# Patient Record
Sex: Female | Born: 1985 | Hispanic: Yes | Marital: Single | Smoking: Never smoker
Health system: Southern US, Community
[De-identification: ages and names within clinical notes are randomized; demographics above are authoritative.]

---

## 2015-03-11 LAB — OB RESULTS CONSOLE GC/CHLAMYDIA
Chlamydia: NEGATIVE
Gonorrhea: NEGATIVE

## 2015-03-11 LAB — OB RESULTS CONSOLE RUBELLA ANTIBODY, IGM: RUBELLA: NON-IMMUNE/NOT IMMUNE

## 2015-03-11 LAB — OB RESULTS CONSOLE ABO/RH: RH TYPE: POSITIVE

## 2015-03-11 LAB — OB RESULTS CONSOLE HIV ANTIBODY (ROUTINE TESTING): HIV: NONREACTIVE

## 2015-03-11 LAB — OB RESULTS CONSOLE RPR: RPR: NONREACTIVE

## 2015-03-11 LAB — OB RESULTS CONSOLE ANTIBODY SCREEN: ANTIBODY SCREEN: NEGATIVE

## 2015-03-11 LAB — OB RESULTS CONSOLE HEPATITIS B SURFACE ANTIGEN: HEP B S AG: NEGATIVE

## 2015-03-15 NOTE — L&D Delivery Note (Cosign Needed)
Delivery Note At 10:08 PM a viable female was delivered via nursing personel (Presentation:vertex ;  ).  APGAR:9 ,9 ; weight  .   Placenta status:spont ,via shultz .  Cord: 3 vc with the following complications: none .  Cord pH: n/a  Anesthesia: None  Episiotomy:  none Lacerations:  none Suture Repair: n/a Est. Blood Loss 100 (mL):    Mom to postpartum.  Baby to Couplet care / Skin to Skin.  Kendra Fitzgerald 07/12/2015, 10:17 PM

## 2015-03-19 ENCOUNTER — Other Ambulatory Visit (HOSPITAL_COMMUNITY): Payer: Self-pay | Admitting: Urology

## 2015-03-19 DIAGNOSIS — Z3689 Encounter for other specified antenatal screening: Secondary | ICD-10-CM

## 2015-04-10 ENCOUNTER — Ambulatory Visit (HOSPITAL_COMMUNITY)
Admission: RE | Admit: 2015-04-10 | Discharge: 2015-04-10 | Disposition: A | Discharge status: Home / Self Care | Payer: Medicaid Other | Source: Ambulatory Visit | Attending: Physician Assistant | Admitting: Physician Assistant

## 2015-04-10 DIAGNOSIS — Z3A26 26 weeks gestation of pregnancy: Secondary | ICD-10-CM | POA: Insufficient documentation

## 2015-04-10 DIAGNOSIS — Z36 Encounter for antenatal screening of mother: Secondary | ICD-10-CM | POA: Insufficient documentation

## 2015-04-10 DIAGNOSIS — Z3689 Encounter for other specified antenatal screening: Secondary | ICD-10-CM

## 2015-06-19 LAB — OB RESULTS CONSOLE GC/CHLAMYDIA
CHLAMYDIA, DNA PROBE: NEGATIVE
Gonorrhea: NEGATIVE

## 2015-06-19 LAB — OB RESULTS CONSOLE GBS: GBS: NEGATIVE

## 2015-07-12 ENCOUNTER — Inpatient Hospital Stay (HOSPITAL_COMMUNITY)
Admission: AD | Admit: 2015-07-12 | Discharge: 2015-07-14 | DRG: 775 | Disposition: A | Discharge status: Home / Self Care | Payer: Medicaid Other | Source: Ambulatory Visit | Attending: Obstetrics and Gynecology | Admitting: Obstetrics and Gynecology

## 2015-07-12 ENCOUNTER — Inpatient Hospital Stay (HOSPITAL_COMMUNITY)
Admission: AD | Admit: 2015-07-12 | Discharge: 2015-07-12 | Disposition: A | Discharge status: Home / Self Care | Payer: Medicaid Other | Source: Ambulatory Visit | Attending: Obstetrics and Gynecology | Admitting: Obstetrics and Gynecology

## 2015-07-12 ENCOUNTER — Encounter (HOSPITAL_COMMUNITY): Payer: Self-pay | Admitting: *Deleted

## 2015-07-12 DIAGNOSIS — Z3A40 40 weeks gestation of pregnancy: Secondary | ICD-10-CM | POA: Diagnosis not present

## 2015-07-12 DIAGNOSIS — O471 False labor at or after 37 completed weeks of gestation: Secondary | ICD-10-CM | POA: Insufficient documentation

## 2015-07-12 DIAGNOSIS — IMO0001 Reserved for inherently not codable concepts without codable children: Secondary | ICD-10-CM

## 2015-07-12 LAB — CBC
HCT: 41 % (ref 36.0–46.0)
Hemoglobin: 14.5 g/dL (ref 12.0–15.0)
MCH: 32.4 pg (ref 26.0–34.0)
MCHC: 35.4 g/dL (ref 30.0–36.0)
MCV: 91.7 fL (ref 78.0–100.0)
Platelets: 218 10*3/uL (ref 150–400)
RBC: 4.47 MIL/uL (ref 3.87–5.11)
RDW: 13.8 % (ref 11.5–15.5)
WBC: 10 10*3/uL (ref 4.0–10.5)

## 2015-07-12 LAB — TYPE AND SCREEN
ABO/RH(D): O POS
ANTIBODY SCREEN: NEGATIVE

## 2015-07-12 LAB — ABO/RH: ABO/RH(D): O POS

## 2015-07-12 MED ORDER — LIDOCAINE HCL (PF) 1 % IJ SOLN
30.0000 mL | INTRAMUSCULAR | Status: DC | PRN
  Filled 2015-07-12: qty 30

## 2015-07-12 MED ORDER — OXYCODONE-ACETAMINOPHEN 5-325 MG PO TABS: 2.0000 | ORAL_TABLET | ORAL | Status: DC | PRN

## 2015-07-12 MED ORDER — LACTATED RINGERS IV SOLN: 500.0000 mL | INTRAVENOUS | Status: DC | PRN

## 2015-07-12 MED ORDER — CITRIC ACID-SODIUM CITRATE 334-500 MG/5ML PO SOLN: 30.0000 mL | ORAL | Status: DC | PRN

## 2015-07-12 MED ORDER — LACTATED RINGERS IV SOLN
INTRAVENOUS | Status: DC
  Administered 2015-07-12 (×2): via INTRAVENOUS

## 2015-07-12 MED ORDER — FLEET ENEMA 7-19 GM/118ML RE ENEM: 1.0000 | ENEMA | RECTAL | Status: DC | PRN

## 2015-07-12 MED ORDER — OXYTOCIN 10 UNIT/ML IJ SOLN
2.5000 [IU]/h | INTRAVENOUS | Status: DC
  Filled 2015-07-12: qty 4

## 2015-07-12 MED ORDER — OXYCODONE-ACETAMINOPHEN 5-325 MG PO TABS: 1.0000 | ORAL_TABLET | ORAL | Status: DC | PRN

## 2015-07-12 MED ORDER — OXYTOCIN BOLUS FROM INFUSION
500.0000 mL | INTRAVENOUS | Status: DC
  Administered 2015-07-12: 500 mL via INTRAVENOUS

## 2015-07-12 MED ORDER — FENTANYL CITRATE (PF) 100 MCG/2ML IJ SOLN: 100.0000 ug | INTRAMUSCULAR | Status: DC | PRN

## 2015-07-12 MED ORDER — ACETAMINOPHEN 325 MG PO TABS: 650.0000 mg | ORAL_TABLET | ORAL | Status: DC | PRN

## 2015-07-12 MED ORDER — FENTANYL CITRATE (PF) 100 MCG/2ML IJ SOLN
INTRAMUSCULAR | Status: AC
  Administered 2015-07-12: 100 ug
  Filled 2015-07-12: qty 2

## 2015-07-12 MED ORDER — ONDANSETRON HCL 4 MG/2ML IJ SOLN: 4.0000 mg | Freq: Four times a day (QID) | INTRAMUSCULAR | Status: DC | PRN

## 2015-07-12 NOTE — Discharge Instructions (Signed)
Contracciones de Braxton Hicks °(Braxton Hicks Contractions) °Durante el embarazo, pueden presentarse contracciones uterinas que no siempre indican que está en trabajo de parto.  °¿QUÉ SON LAS CONTRACCIONES DE BRAXTON HICKS?  °Las contracciones que se presentan antes del trabajo de parto se conocen como contracciones de Braxton Hicks o falso trabajo de parto. Hacia el final del embarazo (32 a 34 semanas), estas contracciones pueden aparecen con más frecuencia y volverse más intensas. No corresponden al trabajo de parto verdadero porque estas contracciones no producen el agrandamiento (la dilatación) y el afinamiento del cuello del útero. Algunas veces, es difícil distinguirlas del trabajo de parto verdadero porque en algunos casos pueden ser muy intensas, y las personas tienen diferentes niveles de tolerancia al dolor. No debe sentirse avergonzada si concurre al hospital con falso trabajo de parto. En ocasiones, la única forma de saber si el trabajo de parto es verdadero es que el médico determine si hay cambios en el cuello del útero. °Si no hay problemas prenatales u otras complicaciones de salud asociadas con el embarazo, no habrá inconvenientes si la envían a su casa con falso trabajo de parto y espera que comience el verdadero. °CÓMO DIFERENCIAR EL TRABAJO DE PARTO FALSO DEL VERDADERO °Falso trabajo de parto °· Las contracciones del falso trabajo de parto duran menos y no son tan intensas como las verdaderas. °· Generalmente son irregulares. °· A menudo, se sienten en la parte delantera de la parte baja del abdomen y en la ingle, °· y pueden desaparecer cuando camina o cambia de posición mientras está acostada. °· Las contracciones se vuelven más débiles y su duración es menor a medida que el tiempo transcurre. °· Por lo general, no se hacen progresivamente más intensas, regulares y cercanas entre sí como en el caso del trabajo de parto verdadero. °Verdadero trabajo de parto °· Las contracciones del verdadero  trabajo de parto duran de 30 a 70 segundos, son muy regulares y suelen volverse más intensas, y aumenta su frecuencia. °· No desaparecen cuando camina. °· La molestia generalmente se siente en la parte superior del útero y se extiende hacia la zona inferior del abdomen y hacia la cintura. °· El médico podrá examinarla para determinar si el trabajo de parto es verdadero. El examen mostrará si el cuello del útero se está dilatando y afinando. °LO QUE DEBE RECORDAR °· Continúe haciendo los ejercicios habituales y siga otras indicaciones que el médico le dé. °· Tome todos los medicamentos como le indicó el médico. °· Concurra a las visitas prenatales regulares. °· Coma y beba con moderación si cree que está en trabajo de parto. °· Si las contracciones de Braxton Hicks le provocan incomodidad: °¨ Cambie de posición: si está acostada o descansando, camine; si está caminando, descanse. °¨ Siéntese y descanse en una bañera con agua tibia. °¨ Beba 2 o 3 vasos de agua. La deshidratación puede provocar contracciones. °¨ Respire lenta y profundamente varias veces por hora. °¿CUÁNDO DEBO BUSCAR ASISTENCIA MÉDICA INMEDIATA? °Solicite atención médica de inmediato si: °· Las contracciones se intensifican, se hacen más regulares y cercanas entre sí. °· Tiene una pérdida de líquido por la vagina. °· Tiene fiebre. °· Elimina mucosidad manchada con sangre. °· Tiene una hemorragia vaginal abundante. °· Tiene dolor abdominal permanente. °· Tiene un dolor en la zona lumbar que nunca tuvo antes. °· Siente que la cabeza del bebé empuja hacia abajo y ejerce presión en la zona pélvica. °· El bebé no se mueve tanto como solía. °  °Esta información no tiene como fin   reemplazar el consejo del médico. Asegúrese de hacerle al médico cualquier pregunta que tenga. °  °Document Released: 12/08/2004 Document Revised: 03/05/2013 °Elsevier Interactive Patient Education ©2016 [employer] ° °

## 2015-07-12 NOTE — H&P (Signed)
Kendra Fitzgerald is a 30 y.o. female presenting for active labor. Maternal Medical History:  Reason for admission: Contractions.   Contractions: Onset was 6-12 hours ago.   Frequency: regular.   Perceived severity is moderate.    Fetal activity: Perceived fetal activity is normal.   Last perceived fetal movement was within the past hour.    Prenatal complications: no prenatal complications Prenatal Complications - Diabetes: none.    OB History    Gravida Para Term Preterm AB TAB SAB Ectopic Multiple Living   2 1 1  0 0 0 0 0 0 1     History reviewed. No pertinent past medical history. History reviewed. No pertinent past surgical history. Family History: family history is not on file. Social History:  reports that she has never smoked. She does not have any smokeless tobacco history on file. She reports that she does not drink alcohol or use illicit drugs.   Prenatal Transfer Tool  Maternal Diabetes: No Genetic Screening: Normal Maternal Ultrasounds/Referrals: Normal Fetal Ultrasounds or other Referrals:  None Maternal Substance Abuse:  No Significant Maternal Medications:  None Significant Maternal Lab Results:  None Other Comments:  None  Review of Systems  Constitutional: Negative.   HENT: Negative.   Eyes: Negative.   Respiratory: Negative.   Cardiovascular: Negative.   Gastrointestinal: Positive for abdominal pain.  Genitourinary: Negative.   Musculoskeletal: Positive for back pain.  Skin: Negative.   Neurological: Negative.   Endo/Heme/Allergies: Negative.   Psychiatric/Behavioral: Negative.     Dilation: 5 Effacement (%): 100 Station: -2 Exam by:: K. WeissRN Blood pressure 119/77, pulse 67, temperature 97.4 F (36.3 C), temperature source Oral, resp. rate 20, height 4\' 8"  (1.422 m), weight 143 lb (64.864 kg), SpO2 95 %. Maternal Exam:  Uterine Assessment: Contraction strength is moderate.  Contraction frequency is regular.   Abdomen: Patient  reports no abdominal tenderness. Fetal presentation: vertex  Introitus: Normal vulva. Normal vagina.  Amniotic fluid character: not assessed.  Pelvis: adequate for delivery.   Cervix: Cervix evaluated by digital exam.     Fetal Exam Fetal Monitor Review: Mode: ultrasound.   Variability: moderate (6-25 bpm).   Pattern: accelerations present.    Fetal State Assessment: Category I - tracings are normal.     Physical Exam  Constitutional: She is oriented to person, place, and time. She appears well-developed and well-nourished.  HENT:  Head: Normocephalic.  Eyes: Pupils are equal, round, and reactive to light.  Cardiovascular: Normal rate, regular rhythm, normal heart sounds and intact distal pulses.   Respiratory: Effort normal and breath sounds normal.  GI: Soft. Bowel sounds are normal.  Genitourinary: Vagina normal.  Musculoskeletal: Normal range of motion.  Neurological: She is alert and oriented to person, place, and time. She has normal reflexes.  Skin: Skin is warm and dry.  Psychiatric: She has a normal mood and affect. Her behavior is normal. Judgment and thought content normal.    Prenatal labs: ABO, Rh: --/--/O POS (04/30 2005) Antibody: PENDING (04/30 2005) Rubella: Nonimmune (12/28 0000) RPR: Nonreactive (12/28 0000)  HBsAg: Negative (12/28 0000)  HIV: Non-reactive (12/28 0000)  GBS: Negative (04/07 0000)   Assessment/Plan: Admit, SVE 6-7/90/-1. GBS neg   Kendra Fitzgerald 07/12/2015, 8:42 PM

## 2015-07-12 NOTE — Progress Notes (Signed)
Assisted Registration with interpretation of admission. Spanish Interpreter

## 2015-07-12 NOTE — MAU Note (Signed)
Vaginal bleeding started last night.

## 2015-07-12 NOTE — Progress Notes (Signed)
Assisted RN with interpretation of assessment and admission questions.  Spanish Interpreter

## 2015-07-12 NOTE — Anesthesia Pain Management Evaluation Note (Signed)
  CRNA Pain Management Visit Note  Patient: Kendra Fitzgerald, 30 y.o., female  "Hello I am a member of the anesthesia team at [facility]. We have an anesthesia team available at all times to provide care throughout the hospital, including epidural management and anesthesia for C-section. I don't know your plan for the delivery whether it a natural birth, water birth, IV sedation, nitrous supplementation, doula or epidural, but we want to meet your pain goals."   1.Was your pain managed to your expectations on prior hospitalizations? Yes   2.What is your expectation for pain management during this hospitalization?     IV pain meds  3.How can we help you reach that goal? Desires natural with IV pain meds  Record the patient's initial score and the patient's pain goal.   Pain: 9  Pain Goal: 9  [facility] wants you to be able to say your pain was always managed very well.  [facility]ptlMERRITT,Carley Strickling 07/12/2015

## 2015-07-12 NOTE — MAU Provider Note (Signed)
  History     CSN: [id number]  Arrival date and time: 07/12/15 [id number]   None     Chief Complaint  Patient presents with  . Vaginal Bleeding   HPI  Kendra Fitzgerald, a 30 yo G2P1 at 5240+0 with no significant PMH, presents with vaginal bleeding in the context of initiation of contraction. Patient noticed a small amount of pink vaginal discharge last night while using the restroom. She compared the quantity of bleeding to the first day of a menstrual period. She states that contractions began afterwards, at 3:20am, which occurred infrequently and were accompanied with occasional vaginal bleeding. She says the only pain she is currently experiencing is that of the irregular contractions. She states that an ultrasound 6 days ago found no abnormalities of placentation. Patient denies any trauma or recent sexual intercourse which could have triggered the bleeding. Patient denies any substance use.   OB History    Gravida Para Term Preterm AB TAB SAB Ectopic Multiple Living   2 1 1  0 0 0 0 0 0 1      History reviewed. No pertinent past medical history.  History reviewed. No pertinent past surgical history.  No family history on file.  Social History  Substance Use Topics  . Smoking status: Never Smoker   . Smokeless tobacco: None  . Alcohol Use: No    Allergies: No Known Allergies  Prescriptions prior to admission  Medication Sig Dispense Refill Last Dose  . Prenatal Vit-Fe Fumarate-FA (MULTIVITAMIN-PRENATAL) 27-0.8 MG TABS tablet Take 1 tablet by mouth daily at 12 noon.   07/11/2015 at Unknown time    ROS  No fevers/chills No headaches No chest pain/SOB  Physical Exam   Blood pressure 105/78, pulse 76, temperature 97.7 F (36.5 C), temperature source Oral, resp. rate 18.  Physical Exam  Gen: alert, in NAD HEENT: NCAT, normal conjunctivae, moist oral mucosa Chest: normal WOB, lungs CTAB CV: normal rate and regular rhythm, normal S1 and S2, no m/r/g Abd:  nontender GU: SVE 2/50 with small amount of brown discharge on finger per RN Skin: no rashes or lesions noted Psych: cooperative, appropriate affect  Reactive NST Irregular contractions on toco  MAU Course  Procedures  MDM Given the small amount of bleeding (both reported and found on cervical exam) and the lack of pain, this bleeding is most likely a normal feature of the onset of labor given the friability of the cervix. More worrisome causes such as placental abruption seem less likely given no abdominal pain except during contractions and lack of trauma or substance history, and abnormalities of placentation such as vasa or placenta previa are less likely given her normal ultrasound findings last week.   Assessment and Plan  [facility] Loralyn Freshwatersidro is a 30 yo G2P1 at 40+0 with scant vaginal bleeding in the context of early and irregular contractions.  -- Not in active labor.  Small amount of bleeding may indicate some cervical change, but SVE 2/50 currently. -- Discharge home with strict labor precautions -- [facility]  Trish FountainKelly Garcia, MD 07/12/15, 10:59am

## 2015-07-13 ENCOUNTER — Encounter (HOSPITAL_COMMUNITY): Payer: Self-pay | Admitting: Student

## 2015-07-13 LAB — RPR: RPR Ser Ql: NONREACTIVE

## 2015-07-13 MED ORDER — IBUPROFEN 600 MG PO TABS
600.0000 mg | ORAL_TABLET | Freq: Four times a day (QID) | ORAL | Status: DC
  Administered 2015-07-13 – 2015-07-14 (×6): 600 mg via ORAL
  Filled 2015-07-13 (×7): qty 1

## 2015-07-13 MED ORDER — COCONUT OIL OIL: 1.0000 "application " | TOPICAL_OIL | Status: DC | PRN

## 2015-07-13 MED ORDER — ACETAMINOPHEN 325 MG PO TABS: 650.0000 mg | ORAL_TABLET | ORAL | Status: DC | PRN

## 2015-07-13 MED ORDER — SENNOSIDES-DOCUSATE SODIUM 8.6-50 MG PO TABS
2.0000 | ORAL_TABLET | ORAL | Status: DC
  Administered 2015-07-13 (×2): 2 via ORAL
  Filled 2015-07-13 (×2): qty 2

## 2015-07-13 MED ORDER — WITCH HAZEL-GLYCERIN EX PADS: 1.0000 "application " | MEDICATED_PAD | CUTANEOUS | Status: DC | PRN

## 2015-07-13 MED ORDER — PRENATAL MULTIVITAMIN CH
1.0000 | ORAL_TABLET | Freq: Every day | ORAL | Status: DC
  Administered 2015-07-13 – 2015-07-14 (×2): 1 via ORAL
  Filled 2015-07-13 (×2): qty 1

## 2015-07-13 MED ORDER — DIPHENHYDRAMINE HCL 25 MG PO CAPS: 25.0000 mg | ORAL_CAPSULE | Freq: Four times a day (QID) | ORAL | Status: DC | PRN

## 2015-07-13 MED ORDER — MEASLES, MUMPS & RUBELLA VAC ~~LOC~~ INJ
0.5000 mL | INJECTION | Freq: Once | SUBCUTANEOUS | Status: AC
Start: 2015-07-14 — End: 2015-07-14
  Administered 2015-07-14: 0.5 mL via SUBCUTANEOUS
  Filled 2015-07-13: qty 0.5

## 2015-07-13 MED ORDER — TETANUS-DIPHTH-ACELL PERTUSSIS 5-2.5-18.5 LF-MCG/0.5 IM SUSP: 0.5000 mL | Freq: Once | INTRAMUSCULAR | Status: DC

## 2015-07-13 MED ORDER — ZOLPIDEM TARTRATE 5 MG PO TABS: 5.0000 mg | ORAL_TABLET | Freq: Every evening | ORAL | Status: DC | PRN

## 2015-07-13 MED ORDER — BENZOCAINE-MENTHOL 20-0.5 % EX AERO: 1.0000 "application " | INHALATION_SPRAY | CUTANEOUS | Status: DC | PRN

## 2015-07-13 MED ORDER — SIMETHICONE 80 MG PO CHEW: 80.0000 mg | CHEWABLE_TABLET | ORAL | Status: DC | PRN

## 2015-07-13 MED ORDER — SODIUM CHLORIDE 0.9 % IV SOLN: 250.0000 mL | INTRAVENOUS | Status: DC | PRN

## 2015-07-13 MED ORDER — ONDANSETRON HCL 4 MG PO TABS: 4.0000 mg | ORAL_TABLET | ORAL | Status: DC | PRN

## 2015-07-13 MED ORDER — DIBUCAINE 1 % RE OINT: 1.0000 "application " | TOPICAL_OINTMENT | RECTAL | Status: DC | PRN

## 2015-07-13 MED ORDER — ONDANSETRON HCL 4 MG/2ML IJ SOLN: 4.0000 mg | INTRAMUSCULAR | Status: DC | PRN

## 2015-07-13 MED ORDER — SODIUM CHLORIDE 0.9% FLUSH: 3.0000 mL | Freq: Two times a day (BID) | INTRAVENOUS | Status: DC

## 2015-07-13 MED ORDER — SODIUM CHLORIDE 0.9% FLUSH: 3.0000 mL | INTRAVENOUS | Status: DC | PRN

## 2015-07-13 NOTE — Progress Notes (Signed)
Post Partum Day 1 Subjective: no complaints, up ad lib, voiding and tolerating PO  Objective: Blood pressure 104/55, pulse 60, temperature 97.7 F (36.5 C), temperature source Oral, resp. rate 18, height 4\' 8"  (1.422 m), weight 143 lb (64.864 kg), SpO2 95 %, unknown if currently breastfeeding.  Physical Exam:  General: alert, cooperative, appears stated age and no distress Lochia: appropriate Uterine Fundus: firm Incision: n/a DVT Evaluation: No evidence of DVT seen on physical exam. Negative Homan's sign. No cords or calf tenderness. No significant calf/ankle edema.   Recent Labs  07/12/15 2005  HGB 14.5  HCT 41.0    Assessment/Plan: Plan for discharge tomorrow   LOS: 1 day   Kendra Fitzgerald 07/13/2015, 7:20 AM

## 2015-07-13 NOTE — Lactation Note (Signed)
This note was copied from a baby's chart. Lactation Consultation Note [name] for 1 yr to her now 30 yr old. Hand expression taught w/good flow of colostrum. Encouraged mom to BF STS. Baby weight 5.15lbs. Mom states she is breast/formula feeding. FOB at bedside and is interpreter for mom if she doesn't understand. Mom has done well at answering the questions I have asked w/o asking FOB. Mom has everted cone shaped breast. Mom had baby wrapped in 2 blankets, unwrapped and encouraged STS. demonstrated football position. Mom liked that position. Discussed newborn behavior. Mom encouraged to feed baby 8-12 times/24 hours and with feeding cues. Reviewed Baby & Me book's Breastfeeding Basics. Encouraged to call for assistance if needed and to verify proper latch.[facility]/LC brochure given w/resources, support groups and LC services. Patient Name: Kendra Fitzgerald Today's Date: 07/13/2015 Reason for consult: Initial assessment   Maternal Data Has patient been taught Hand Expression?: Yes Does the patient have breastfeeding experience prior to this delivery?: Yes  Feeding Feeding Type: Breast Fed Length of feed: 15 min  LATCH Score/Interventions Latch: Grasps breast easily, tongue down, lips flanged, rhythmical sucking. Intervention(s): Adjust position;Assist with latch;Breast massage;Breast compression  Audible Swallowing: Spontaneous and intermittent Intervention(s): Skin to skin;Hand expression;Alternate breast massage  Type of Nipple: Everted at rest and after stimulation  Comfort (Breast/Nipple): Soft / non-tender     Hold (Positioning): No assistance needed to correctly position infant at breast. Intervention(s): Skin to skin;Position options;Support Pillows;Breastfeeding basics reviewed  LATCH Score: 10  Lactation Tools Discussed/Used     Consult Status Consult Status: Follow-up Date: 07/14/15 Follow-up type: In-patient    Michio Thier, Diamond NickelLAURA G 07/13/2015, 5:03  AM

## 2015-07-13 NOTE — Lactation Note (Signed)
This note was copied from a baby's chart. Lactation Consultation Note  Patient Name: Kendra Fitzgerald IsidroCasimiro Needle [id number]'s Date: 07/13/2015 Reason for consult: Follow-up assessment   With this mom and term baby, now 3216 hours old, and reports breast feeding going well. Mom breast fed her first for 1 year, and denies any questions/concerns at this time. Mom knows to call for questions/concerns.    Maternal Data    Feeding Feeding Type: Breast Fed  LATCH Score/Interventions                      Lactation Tools Discussed/Used     Consult Status Consult Status: PRN Follow-up type: Call as needed    Alfred LevinsLee, Harrol Novello Anne 07/13/2015, 2:40 PM

## 2015-07-13 NOTE — Progress Notes (Signed)
UR chart review completed.  

## 2015-07-14 MED ORDER — IBUPROFEN 600 MG PO TABS: 600.0000 mg | ORAL_TABLET | Freq: Four times a day (QID) | ORAL | Status: DC

## 2015-07-14 NOTE — Discharge Instructions (Signed)

## 2015-07-14 NOTE — Discharge Summary (Signed)
OB Discharge Summary  Patient Name: Kendra Fitzgerald DOB: [birth year] MRN: [id number]  Date of admission: 07/12/2015 Delivering MD: Wyvonnia Dusky D   Date of discharge: 07/14/2015  Admitting diagnosis: 40 WKS, CTXS Intrauterine pregnancy: [redacted]w[redacted]d     Secondary diagnosis:Principal Problem:   NSVD (normal spontaneous vaginal delivery) Active Problems:   Active labor  Additional problems: None    Discharge diagnosis: Term Pregnancy Delivered                                                                     Post partum procedures:none  Augmentation: none  Complications: None  Hospital course:  Onset of Labor With Vaginal Delivery     30 y.o. yo [id number] at [redacted]w[redacted]d was admitted in Active Labor on 07/12/2015. Patient had an uncomplicated labor course as follows:  Membrane Rupture Time/Date: 12:00 PM ,07/12/2015   Intrapartum Procedures: Episiotomy: None [1]                                         Lacerations:  None [1]  Patient had a delivery of a Viable infant. 07/12/2015  Information for the patient's newborn:  Kendra Fitzgerald, Girl Kendra Fitzgerald [[id number]]  Delivery Method: Vaginal, Spontaneous Delivery (Filed from Delivery Summary)    Pateint had an uncomplicated postpartum course.  She is ambulating, tolerating a regular diet, passing flatus, and urinating well. Patient is discharged home in stable condition on 07/14/2015.    Physical exam  Filed Vitals:   07/13/15 0500 07/13/15 1225 07/13/15 1800 07/14/15 0631  BP: 104/55 95/59 107/66 100/50  Pulse: 60 61 70 53  Temp: 97.7 F (36.5 C) 98.5 F (36.9 C) 98.2 F (36.8 C) 97.8 F (36.6 C)  TempSrc: Oral Oral Oral Oral  Resp: Height:      Weight:      SpO2:       General: alert, cooperative and no distress Lochia: appropriate Uterine Fundus: firm Incision: N/A DVT Evaluation: No evidence of DVT seen on physical exam. Labs: Lab Results  Component Value Date   WBC 10.0 07/12/2015   HGB 14.5  07/12/2015   HCT 41.0 07/12/2015   MCV 91.7 07/12/2015   PLT 218 07/12/2015   No flowsheet data found.  Discharge instruction: per After Visit Summary and "Baby and Me Booklet".  After Visit Meds:    Medication List    TAKE these medications        ibuprofen 600 MG tablet  Commonly known as:  ADVIL,MOTRIN  Take 1 tablet (600 mg total) by mouth every 6 (six) hours.      ASK your doctor about these medications        multivitamin-prenatal 27-0.8 MG Tabs tablet  Take 1 tablet by mouth daily at 12 noon.        Diet: routine diet  Activity: Advance as tolerated. Pelvic rest for 6 weeks.   Outpatient follow up:6 weeks Follow up Appt:No future appointments. Follow up visit: No Follow-up on file.  Postpartum contraception: Depo Provera  Newborn Data: Live born female  Birth Weight: 5 lb 15.1 oz (2696 g) APGAR: 9,  10  Baby Feeding: Bottle and Breast Disposition:home with mother   07/14/2015 Hilton SinclairKaty D Mayo, MD  OB fellow attestation I have seen and examined this patient and agree with above documentation in the resident's note.   Kendra Fitzgerald is a 30 y.o. [phone] s/p NSVD.   Pain is well controlled.  Plan for birth control is Depo-Provera.  Method of Feeding: both  PE:  BP 100/50 mmHg  Pulse 53  Temp(Src) 97.8 F (36.6 C) (Oral)  Resp 18  Ht 4\' 8"  (1.422 m)  Wt 143 lb (64.864 kg)  BMI 32.08 kg/m2  SpO2 95%  Breastfeeding? Unknown Gen: well appearing Heart: reg rate Lungs: normal WOB Fundus firm Ext: soft, no pain, no edema   Recent Labs  07/12/15 2005  HGB 14.5  HCT 41.0   Plan: discharge today - postpartum care discussed - f/u clinic in 6 weeks for postpartum visit  Federico FlakeKimberly Niles Emiliano Welshans, MD 11:43 AM  Attending Physician: Tinnie Gensanya Pratt

## 2020-02-19 ENCOUNTER — Encounter (HOSPITAL_COMMUNITY): Payer: Self-pay

## 2020-02-19 ENCOUNTER — Other Ambulatory Visit: Payer: Self-pay

## 2020-02-19 ENCOUNTER — Ambulatory Visit (HOSPITAL_COMMUNITY)
Admission: EM | Admit: 2020-02-19 | Discharge: 2020-02-19 | Disposition: A | Discharge status: Home / Self Care | Payer: Self-pay | Attending: Emergency Medicine | Admitting: Emergency Medicine

## 2020-02-19 DIAGNOSIS — L03114 Cellulitis of left upper limb: Secondary | ICD-10-CM

## 2020-02-19 MED ORDER — DOXYCYCLINE HYCLATE 100 MG PO CAPS
100.0000 mg | ORAL_CAPSULE | Freq: Two times a day (BID) | ORAL | 0 refills | Status: AC
Start: 2020-02-19 — End: 2020-02-29

## 2020-02-19 NOTE — ED Provider Notes (Signed)
[facility]    CSN: [id number] Arrival date & time: 02/19/20  1342      History   Chief Complaint Chief Complaint  Patient presents with  . Arm Pain    left    HPI [name] is a 34 y.o. female.   Presents with left upper arm redness, pain, swelling x2 weeks.  She states she has Nexplanon implant which was supposed to be removed in October.  She was unable to get an appointment then.  She had a fever last week and went to have it removed; she states they would not remove it due to the infection; she was started on Septra DS which she has been taking since 02/13/2020.  She denies numbness, weakness, paresthesias in her arm.  She denies fever since last week.  The history is provided by the patient. A language interpreter was used.    History reviewed. No pertinent past medical history.  Patient Active Problem List   Diagnosis Date Noted  . NSVD (normal spontaneous vaginal delivery) 07/14/2015  . Active labor 07/12/2015    History reviewed. No pertinent surgical history.  OB History    Gravida  2   Para  2   Term  2   Preterm  0   AB  0   Living  2     SAB  0   TAB  0   Ectopic  0   Multiple  0   Live Births  1            Home Medications    Prior to Admission medications   Medication Sig Start Date End Date Taking? Authorizing Provider  doxycycline (VIBRAMYCIN) 100 MG capsule Take 1 capsule (100 mg total) by mouth 2 (two) times daily for 10 days. 02/19/20 02/29/20  Mickie Bail, NP  ibuprofen (ADVIL,MOTRIN) 600 MG tablet Take 1 tablet (600 mg total) by mouth every 6 (six) hours. 07/14/15   Mayo, Allyn Kenner, MD  Prenatal Vit-Fe Fumarate-FA (MULTIVITAMIN-PRENATAL) 27-0.8 MG TABS tablet Take 1 tablet by mouth daily at 12 noon.    [provider]    Family History History reviewed. No pertinent family history.  Social History Social History   Tobacco Use  . Smoking status: Never Smoker  . Smokeless tobacco: Never  Used  Substance Use Topics  . Alcohol use: No  . Drug use: No     Allergies   Patient has no known allergies.   Review of Systems Review of Systems  Constitutional: Negative for chills and fever.  HENT: Negative for ear pain and sore throat.   Eyes: Negative for pain and visual disturbance.  Respiratory: Negative for cough and shortness of breath.   Cardiovascular: Negative for chest pain and palpitations.  Gastrointestinal: Negative for abdominal pain and vomiting.  Genitourinary: Negative for dysuria and hematuria.  Musculoskeletal: Negative for arthralgias and back pain.  Skin: Positive for color change. Negative for wound.  Neurological: Negative for seizures, syncope, weakness and numbness.  All other systems reviewed and are negative.    Physical Exam Triage Vital Signs ED Triage Vitals  Enc Vitals Group     BP      Pulse      Resp      Temp      Temp src      SpO2      Weight      Height      Head Circumference      Peak  Flow      Pain Score      Pain Loc      Pain Edu?      Excl. in GC?    No data found.  Updated Vital Signs BP 105/64 (BP Location: Left Arm)   Pulse 79   Temp 97.8 F (36.6 C) (Oral)   Resp 17   LMP 02/03/2020 (Approximate)   SpO2 98%   Visual Acuity Right Eye Distance:   Left Eye Distance:   Bilateral Distance:    Right Eye Near:   Left Eye Near:    Bilateral Near:     Physical Exam Vitals and nursing note reviewed.  Constitutional:      General: She is not in acute distress.    Appearance: She is well-developed. She is not ill-appearing.  HENT:     Head: Normocephalic and atraumatic.     Mouth/Throat:     Mouth: Mucous membranes are moist.  Eyes:     Conjunctiva/sclera: Conjunctivae normal.  Cardiovascular:     Rate and Rhythm: Normal rate and regular rhythm.     Heart sounds: No murmur heard.   Pulmonary:     Effort: Pulmonary effort is normal. No respiratory distress.     Breath sounds: Normal breath  sounds.  Abdominal:     Palpations: Abdomen is soft.     Tenderness: There is no abdominal tenderness.  Musculoskeletal:        General: Swelling and tenderness present. Normal range of motion.     Cervical back: Neck supple.  Skin:    General: Skin is warm and dry.     Capillary Refill: Capillary refill takes less than 2 seconds.     Findings: Erythema present. No lesion.     Comments: Left upper arm: 3 cm area of firm nonfluctuant induration with surrounding erythema and mild edema.  No wounds.  Neurological:     General: No focal deficit present.     Mental Status: She is alert and oriented to person, place, and time.     Gait: Gait normal.  Psychiatric:        Mood and Affect: Mood normal.        Behavior: Behavior normal.      UC Treatments / Results  Labs (all labs ordered are listed, but only abnormal results are displayed) Labs Reviewed - No data to display  EKG   Radiology No results found.  Procedures Procedures (including critical care time)  Medications Ordered in UC Medications - No data to display  Initial Impression / Assessment and Plan / UC Course  I have reviewed the triage vital signs and the nursing notes.  Pertinent labs & imaging results that were available during my care of the patient were reviewed by me and considered in my medical decision making (see chart for details).   Cellulitis of left upper arm.  Needle aspiration with blood return only.  The area is nonfluctuant.  Instructed patient to stop taking the Septra DS and start doxycycline today.  Instructed her to call [facility] to schedule the soonest available appointment for removal of her Nexplanon.  Instructed her to go to the ED if she has signs of worsening infection which were reviewed in detail.  Patient agrees to plan of care.   Final Clinical Impressions(s) / UC Diagnoses   Final diagnoses:  Cellulitis of left upper arm     Discharge Instructions     Call  [facility] to schedule the soonest available appointment.  [phone]  Start taking the doxycycline as directed.    Go to the emergency department if you have signs of worsening infection, including increased redness, swelling, pain, fever, chills, or other concerning symptoms.           ED Prescriptions    Medication Sig Dispense Auth. Provider   doxycycline (VIBRAMYCIN) 100 MG capsule Take 1 capsule (100 mg total) by mouth 2 (two) times daily for 10 days. 20 capsule Mickie Bail, NP     PDMP not reviewed this encounter.   Mickie Bail, NP 02/19/20 1538

## 2020-02-19 NOTE — ED Triage Notes (Signed)
Pt presents with left arm pain and swelling. Pt states that she has the nexlpanon implant that was supposed to be removed on 10/10. Pt states she went to the health department, they mentioned she would have to have to be treated here. Pt states she had a fever last week. Pt states the implant was not removed, she was given the depo shot.

## 2020-02-19 NOTE — Discharge Instructions (Addendum)
Call [facility] to schedule the soonest available appointment.  [phone]  Start taking the doxycycline as directed.    Go to the emergency department if you have signs of worsening infection, including increased redness, swelling, pain, fever, chills, or other concerning symptoms.

## 2021-07-09 ENCOUNTER — Inpatient Hospital Stay (HOSPITAL_COMMUNITY)
Admission: EM | Admit: 2021-07-09 | Discharge: 2021-07-13 | DRG: 419 | Disposition: A | Discharge status: Home / Self Care | Payer: MEDICAID | Attending: Surgery | Admitting: Surgery

## 2021-07-09 ENCOUNTER — Emergency Department (HOSPITAL_COMMUNITY): Payer: MEDICAID

## 2021-07-09 ENCOUNTER — Inpatient Hospital Stay (HOSPITAL_COMMUNITY): Payer: MEDICAID

## 2021-07-09 ENCOUNTER — Encounter (HOSPITAL_COMMUNITY): Payer: Self-pay

## 2021-07-09 ENCOUNTER — Other Ambulatory Visit: Payer: Self-pay

## 2021-07-09 DIAGNOSIS — K851 Biliary acute pancreatitis without necrosis or infection: Principal | ICD-10-CM | POA: Diagnosis present

## 2021-07-09 DIAGNOSIS — K8020 Calculus of gallbladder without cholecystitis without obstruction: Principal | ICD-10-CM

## 2021-07-09 DIAGNOSIS — R1084 Generalized abdominal pain: Secondary | ICD-10-CM

## 2021-07-09 LAB — COMPREHENSIVE METABOLIC PANEL
ALT: 174 U/L — ABNORMAL HIGH (ref 0–44)
AST: 81 U/L — ABNORMAL HIGH (ref 15–41)
Albumin: 3.7 g/dL (ref 3.5–5.0)
Alkaline Phosphatase: 128 U/L — ABNORMAL HIGH (ref 38–126)
Anion gap: 6 (ref 5–15)
BUN: 7 mg/dL (ref 6–20)
CO2: 23 mmol/L (ref 22–32)
Calcium: 8.6 mg/dL — ABNORMAL LOW (ref 8.9–10.3)
Chloride: 108 mmol/L (ref 98–111)
Creatinine, Ser: 0.77 mg/dL (ref 0.44–1.00)
GFR, Estimated: >60 mL/min (ref >60)
Glucose, Bld: 115 mg/dL — ABNORMAL HIGH (ref 70–99)
Potassium: 3.5 mmol/L (ref 3.5–5.1)
Sodium: 137 mmol/L (ref 135–145)
Total Bilirubin: 1.9 mg/dL — ABNORMAL HIGH (ref 0.3–1.2)
Total Protein: 6.7 g/dL (ref 6.5–8.1)

## 2021-07-09 LAB — URINALYSIS, ROUTINE W REFLEX MICROSCOPIC
Bilirubin Urine: NEGATIVE
Glucose, UA: NEGATIVE mg/dL
Hgb urine dipstick: NEGATIVE
Ketones, ur: NEGATIVE mg/dL
Leukocytes,Ua: NEGATIVE
Nitrite: NEGATIVE
Protein, ur: 30 mg/dL — AB
Specific Gravity, Urine: 1.025 (ref 1.005–1.030)
pH: 5 (ref 5.0–8.0)

## 2021-07-09 LAB — CBC
HCT: 44.3 % (ref 36.0–46.0)
Hemoglobin: 14.4 g/dL (ref 12.0–15.0)
MCH: 28 pg (ref 26.0–34.0)
MCHC: 32.5 g/dL (ref 30.0–36.0)
MCV: 86.2 fL (ref 80.0–100.0)
Platelets: 283 10*3/uL (ref 150–400)
RBC: 5.14 MIL/uL — ABNORMAL HIGH (ref 3.87–5.11)
RDW: 14.7 % (ref 11.5–15.5)
WBC: 11.5 10*3/uL — ABNORMAL HIGH (ref 4.0–10.5)
nRBC: 0 % (ref 0.0–0.2)

## 2021-07-09 LAB — LIPASE, BLOOD: Lipase: 639 U/L — ABNORMAL HIGH (ref 11–51)

## 2021-07-09 LAB — I-STAT BETA HCG BLOOD, ED (MC, WL, AP ONLY): I-stat hCG, quantitative: <5 m[IU]/mL (ref <5)

## 2021-07-09 MED ORDER — HYDRALAZINE HCL 20 MG/ML IJ SOLN: 10.0000 mg | INTRAMUSCULAR | Status: DC | PRN

## 2021-07-09 MED ORDER — DIPHENHYDRAMINE HCL 25 MG PO CAPS
25.0000 mg | ORAL_CAPSULE | Freq: Four times a day (QID) | ORAL | Status: DC | PRN
Start: 2021-07-09 — End: 2021-07-13

## 2021-07-09 MED ORDER — ONDANSETRON 4 MG PO TBDP: 4.0000 mg | ORAL_TABLET | Freq: Four times a day (QID) | ORAL | Status: DC | PRN

## 2021-07-09 MED ORDER — MORPHINE SULFATE (PF) 4 MG/ML IV SOLN
4.0000 mg | INTRAVENOUS | Status: DC | PRN
  Administered 2021-07-09 – 2021-07-13 (×8): 4 mg via INTRAVENOUS
  Filled 2021-07-09 (×8): qty 1

## 2021-07-09 MED ORDER — ACETAMINOPHEN 650 MG RE SUPP: 650.0000 mg | Freq: Four times a day (QID) | RECTAL | Status: DC | PRN

## 2021-07-09 MED ORDER — ACETAMINOPHEN 325 MG PO TABS
650.0000 mg | ORAL_TABLET | Freq: Four times a day (QID) | ORAL | Status: DC | PRN
Start: 2021-07-09 — End: 2021-07-13

## 2021-07-09 MED ORDER — ONDANSETRON HCL 4 MG/2ML IJ SOLN
4.0000 mg | Freq: Four times a day (QID) | INTRAMUSCULAR | Status: DC | PRN
  Administered 2021-07-09: 4 mg via INTRAVENOUS
  Filled 2021-07-09: qty 2

## 2021-07-09 MED ORDER — DIPHENHYDRAMINE HCL 50 MG/ML IJ SOLN: 25.0000 mg | Freq: Four times a day (QID) | INTRAMUSCULAR | Status: DC | PRN

## 2021-07-09 MED ORDER — ENOXAPARIN SODIUM 40 MG/0.4ML IJ SOSY
40.0000 mg | PREFILLED_SYRINGE | INTRAMUSCULAR | Status: DC
  Administered 2021-07-09 – 2021-07-12 (×4): 40 mg via SUBCUTANEOUS
  Filled 2021-07-09 (×4): qty 0.4

## 2021-07-09 MED ORDER — KCL IN DEXTROSE-NACL 20-5-0.45 MEQ/L-%-% IV SOLN
INTRAVENOUS | Status: DC
  Filled 2021-07-09 (×3): qty 1000

## 2021-07-09 MED ORDER — IOHEXOL 300 MG/ML  SOLN
100.0000 mL | Freq: Once | INTRAMUSCULAR | Status: AC | PRN
  Administered 2021-07-09: 100 mL via INTRAVENOUS

## 2021-07-09 NOTE — ED Provider Notes (Signed)
?[facility] EMERGENCY DEPARTMENT ?Provider Note ? ? ?CSN: [id number] ?Arrival date & time: 07/09/21  7342 ? ?  ? ?History ? ?Chief Complaint  ?Patient presents with  ? Abdominal Pain  ? ? ?Kendra Fitzgerald Loralyn Freshwater is a 36 y.o. female. ? ?Pt reports right upper abdominal pain for 2 weeks.  Pt has had similar episodes over 5 years on and off.  Pain worse this time.   ? ?The history is provided by the patient.  ?Abdominal Pain ?Pain location:  RUQ ?Pain quality: aching   ?Pain radiates to:  RUQ ?Pain severity:  Moderate ?Onset quality:  Gradual ?Duration:  2 weeks ?Timing:  Constant ?Progression:  Worsening ?Chronicity:  New ?Context: not alcohol use and not sick contacts   ?Relieved by:  Nothing ?Worsened by:  Nothing ?Ineffective treatments:  None tried ?Associated symptoms: anorexia and nausea   ?Associated symptoms: no chills and no fever   ?Risk factors: has not had multiple surgeries   ? ?  ? ?Home Medications ?Prior to Admission medications   ?Medication Sig Start Date End Date Taking? Authorizing Provider  ?ibuprofen (ADVIL,MOTRIN) 600 MG tablet Take 1 tablet (600 mg total) by mouth every 6 (six) hours. 07/14/15   Mayo, Allyn Kenner, MD  ?Prenatal Vit-Fe Fumarate-FA (MULTIVITAMIN-PRENATAL) 27-0.8 MG TABS tablet Take 1 tablet by mouth daily at 12 noon.    [provider]  ?   ? ?Allergies    ?Patient has no known allergies.   ? ?Review of Systems   ?Review of Systems  ?Constitutional:  Negative for chills and fever.  ?Gastrointestinal:  Positive for abdominal pain, anorexia and nausea.  ?All other systems reviewed and are negative. ? ?Physical Exam ?Updated Vital Signs ?BP 115/76   Pulse 81   Temp 98.4 ?F (36.9 ?C) (Oral)   Resp 18   Ht 4\' 8"  (1.422 m)   Wt 74.8 kg   SpO2 96%   BMI 36.99 kg/m?  ?Physical Exam ?Vitals and nursing note reviewed.  ?Constitutional:   ?   Appearance: She is well-developed.  ?HENT:  ?   Head: Normocephalic.  ?Cardiovascular:  ?   Rate and Rhythm: Normal rate  and regular rhythm.  ?Pulmonary:  ?   Effort: Pulmonary effort is normal.  ?Abdominal:  ?   General: Abdomen is flat. There is no distension.  ?   Palpations: Abdomen is soft.  ?   Tenderness: There is abdominal tenderness in the right upper quadrant.  ?Musculoskeletal:     ?   General: Normal range of motion.  ?   Cervical back: Normal range of motion.  ?Skin: ?   General: Skin is warm.  ?   Findings: No rash.  ?Neurological:  ?   Mental Status: She is alert and oriented to person, place, and time.  ?Psychiatric:     ?   Mood and Affect: Mood is not anxious.  ? ? ?ED Results / Procedures / Treatments   ?Labs ?(all labs ordered are listed, but only abnormal results are displayed) ?Labs Reviewed  ?LIPASE, BLOOD - Abnormal; Notable for the following components:  ?    Result Value  ? Lipase 639 (*)   ? All other components within normal limits  ?COMPREHENSIVE METABOLIC PANEL - Abnormal; Notable for the following components:  ? Glucose, Bld 115 (*)   ? Calcium 8.6 (*)   ? AST 81 (*)   ? ALT 174 (*)   ? Alkaline Phosphatase 128 (*)   ? Total  Bilirubin 1.9 (*)   ? All other components within normal limits  ?CBC - Abnormal; Notable for the following components:  ? WBC 11.5 (*)   ? RBC 5.14 (*)   ? All other components within normal limits  ?URINALYSIS, ROUTINE W REFLEX MICROSCOPIC - Abnormal; Notable for the following components:  ? APPearance HAZY (*)   ? Protein, ur 30 (*)   ? Bacteria, UA RARE (*)   ? All other components within normal limits  ?I-STAT BETA HCG BLOOD, ED ([facility], [facility], [facility] ONLY)  ? ? ?EKG ?None ? ?Radiology ?US Abdomen Complete ? ?Result Date: 07/09/2021 ?CLINICAL DATA:  Abdominal pain since 5 p.m. yesterday. EXAM: ABDOMEN ULTRASOUND COMPLETE COMPARISON:  None. FINDINGS: The sonographer reports decreased resolution due to large body habitus. Gallbladder: There are multiple echogenic shadowing gallstones measuring up to 6 mm. No gallbladder wall thickening pericholecystic fluid or sonographic Murphy's sign.  Common bile duct: Diameter: 4 mm. No intrahepatic biliary ductal dilatation is seen. Liver: Heterogeneous liver parenchyma with increased echogenicity and attenuation suggesting fatty infiltration. There is an area of geographic decreased echogenicity without increased color flow vascularity within the right hepatic lobe near the gallbladder, compatible with fatty sparing commonly seen near the gallbladder fossa. Smooth liver contours. Portal vein is patent on color Doppler imaging with normal direction of blood flow towards the liver. IVC: No abnormality visualized. The infrahepatic IVC is not visualized. Pancreas: Not well visualized due to overlying bowel gas. Spleen: Size and appearance within normal limits. Right Kidney: Length: 10.0 cm. Echogenicity within normal limits. No mass or hydronephrosis visualized. Left Kidney: Length: 10.2 cm. Echogenicity within normal limits. No mass or hydronephrosis visualized. Abdominal aorta: No aneurysm visualized. The distal bifurcation is not visualized due to overlying bowel gas. Other findings: None. IMPRESSION:: IMPRESSION: 1. Cholelithiasis without sonographic evidence of acute cholecystitis. 2. Fatty infiltration of the liver with fatty sparing adjacent to the gallbladder fossa. Electronically Signed   By: Neita Garnet M.D.   On: 07/09/2021 13:37   ? ?Procedures ?Procedures  ? ? ?Medications Ordered in ED ?Medications - No data to display ? ?ED Course/ Medical Decision Making/ A&P ?  ?                        ?Medical Decision Making ?Pt complains of abdominal pain in right upper abdomen ? ?Amount and/or Complexity of Data Reviewed ?External Data Reviewed: notes. ?   Details: Pt sent here from [facility],  Notes reviewed. ?Labs: ordered. ?   Details: Labs ordered reviewed and discussed with pt. Pt has a lipase of 639 tbili of 1.0 and wbc of 11.5 ?Radiology: ordered and independent interpretation performed. Decision-making details documented in ED Course. ?    Details: Ultrasound shows cholelithiass ?Discussion of management or test interpretation with external provider(s): I spoke to General surgery PA  Bailey Mech will see and admit   ? ?Risk ?Decision regarding hospitalization. ? ? ? ? ? ? ? ? ? ? ?Final Clinical Impression(s) / ED Diagnoses ?Final diagnoses:  ?Calculus of gallbladder without cholecystitis without obstruction  ?Generalized abdominal pain  ? ? ?Rx / DC Orders ?ED Discharge Orders   ? ? None  ? ?  ? ? ?  ?Elson Areas, [place] ?07/09/21 1511 ? ?  ?Benjiman Core, MD ?07/13/21 1500 ? ?

## 2021-07-09 NOTE — H&P (Addendum)
? ? ? ?[facility] ?1985-11-22  ?[id number].   ? ?Requesting MD: Alvino Chapel, MD ?Chief Complaint/Reason for Consult: gallstone pancreatitis  ? ?HPI:  ?Kendra Fitzgerald is a 36 year old female with no significant past medical history or daily medication use who presented to the emergency department with worsening abdominal pain.  States that this pain started 1 to 2 days ago in her epigastric region and radiated all across her upper abdomen.  The pain is now worse and she feels discomfort in her lower abdomen as well.  Associated symptoms include chills, sweats, nausea, and vomiting.  She denies sick contacts, recent travel, chest pain, shortness of breath, urinary symptoms, hematemesis, or blood in her stool.  She reports similar pain in the past.  States every couple of months she has mild upper abdominal pain and occasionally vomits.  She finds this can be associated with eating spicy foods.  She denies a known history of gallstones, gastritis, or peptic ulcer disease.  She reports no known drug allergies.  Denies any history of abdominal surgery.  Denies tobacco or drug use.  Reports occasional alcohol use, for example around the holidays.  She currently works part-time as a Educational psychologist.  The patient is Spanish-speaking, video interpreter was used to gather history. ? ?ROS: As above ?Review of Systems  ?All other systems reviewed and are negative. ? ?History reviewed. No pertinent family history. ? ?History reviewed. No pertinent past medical history. ? ?History reviewed. No pertinent surgical history. ? ?Social History:  reports that she has never smoked. She has never used smokeless tobacco. She reports that she does not drink alcohol and does not use drugs. ? ?Allergies: No Known Allergies ? ?(Not in a hospital admission) ? ? ? ?Physical Exam: ?Blood pressure 106/76, pulse 91, temperature 98.4 ?F (36.9 ?C), temperature source Oral, resp. rate 20, height 4' 8"  (1.422 m), weight 74.8 kg, SpO2 98 %,  unknown if currently breastfeeding. ?General: Pleasant female laying on hospital bed, appears stated age, NAD. ?HEENT: head -normocephalic, atraumatic; anicteric sclerae ?Neck- Trachea is midline, no thyromegaly  ?CV- RRR, normal S1/S2, no M/R/G, no peripheral edema  ?pulm- breathing is non-labored on room air.  ?Abd- soft, tender to palpation in the epigastric region with voluntary guarding, negative Murphy sign, there is no peritonitis, there are no masses, hernias, or organomegaly ?GU- deferred  ?MSK- UE/LE symmetrical, no cyanosis, clubbing, or edema. ?Neuro- CN II-XII grossly in tact, no paresthesias, gait not assessed ?Psych- Alert and Oriented x3 with appropriate affect ?Skin: warm and dry, no rashes or lesions ? ? ?Results for orders placed or performed during the hospital encounter of 07/09/21 (from the past 48 hour(s))  ?Lipase, blood     Status: Abnormal  ? Collection Time: 07/09/21  9:25 AM  ?Result Value Ref Range  ? Lipase 639 (H) 11 - 51 U/L  ?  Comment: RESULTS CONFIRMED BY MANUAL DILUTION ?Performed at [facility], [place] [address], [place], [place] [address] ?  ?Comprehensive metabolic panel     Status: Abnormal  ? Collection Time: 07/09/21  9:25 AM  ?Result Value Ref Range  ? Sodium 137 135 - 145 mmol/L  ? Potassium 3.5 3.5 - 5.1 mmol/L  ? Chloride 108 98 - 111 mmol/L  ? CO2 23 22 - 32 mmol/L  ? Glucose, Bld 115 (H) 70 - 99 mg/dL  ?  Comment: Glucose reference range applies only to samples taken after fasting for at least 8 hours.  ? BUN 7 6 -  20 mg/dL  ? Creatinine, Ser 0.77 0.44 - 1.00 mg/dL  ? Calcium 8.6 (L) 8.9 - 10.3 mg/dL  ? Total Protein 6.7 6.5 - 8.1 g/dL  ? Albumin 3.7 3.5 - 5.0 g/dL  ? AST 81 (H) 15 - 41 U/L  ? ALT 174 (H) 0 - 44 U/L  ? Alkaline Phosphatase 128 (H) 38 - 126 U/L  ? Total Bilirubin 1.9 (H) 0.3 - 1.2 mg/dL  ? GFR, Estimated >60 >60 mL/min  ?  Comment: (NOTE) ?Calculated using the CKD-EPI Creatinine Equation (2021) ?  ? Anion gap 6 5 - 15  ?  Comment: Performed at  [facility], [place] [address], [place], [place] [address]  ?CBC     Status: Abnormal  ? Collection Time: 07/09/21  9:25 AM  ?Result Value Ref Range  ? WBC 11.5 (H) 4.0 - 10.5 K/uL  ? RBC 5.14 (H) 3.87 - 5.11 MIL/uL  ? Hemoglobin 14.4 12.0 - 15.0 g/dL  ? HCT 44.3 36.0 - 46.0 %  ? MCV 86.2 80.0 - 100.0 fL  ? MCH 28.0 26.0 - 34.0 pg  ? MCHC 32.5 30.0 - 36.0 g/dL  ? RDW 14.7 11.5 - 15.5 %  ? Platelets 283 150 - 400 K/uL  ? nRBC 0.0 0.0 - 0.2 %  ?  Comment: Performed at [facility], [place] [address] [place], [place] [address]  ?Urinalysis, Routine w reflex microscopic Urine, Clean Catch     Status: Abnormal  ? Collection Time: 07/09/21  9:41 AM  ?Result Value Ref Range  ? Color, Urine YELLOW YELLOW  ? APPearance HAZY (A) CLEAR  ? Specific Gravity, Urine 1.025 1.005 - 1.030  ? pH 5.0 5.0 - 8.0  ? Glucose, UA NEGATIVE NEGATIVE mg/dL  ? Hgb urine dipstick NEGATIVE NEGATIVE  ? Bilirubin Urine NEGATIVE NEGATIVE  ? Ketones, ur NEGATIVE NEGATIVE mg/dL  ? Protein, ur 30 (A) NEGATIVE mg/dL  ? Nitrite NEGATIVE NEGATIVE  ? Leukocytes,Ua NEGATIVE NEGATIVE  ? RBC / HPF 0-5 0 - 5 RBC/hpf  ? WBC, UA 0-5 0 - 5 WBC/hpf  ? Bacteria, UA RARE (A) NONE SEEN  ? Squamous Epithelial / LPF 11-20 0 - 5  ? Mucus PRESENT   ?  Comment: Performed at [facility], [place] [address], [place], [place] [address]  ?I-Stat beta hCG blood, ED     Status: None  ? Collection Time: 07/09/21 10:00 AM  ?Result Value Ref Range  ? I-stat hCG, quantitative <5.0 <5 mIU/mL  ? Comment 3          ?  Comment:   GEST. AGE      CONC.  (mIU/mL) ?  <=1 WEEK        5 - 50 ?    2 WEEKS       50 - 500 ?    3 WEEKS       100 - 10,000 ?    4 WEEKS     1,000 - 30,000 ?       ?FEMALE AND NON-PREGNANT FEMALE: ?    LESS THAN 5 mIU/mL ?  ? ?US Abdomen Complete ? ?Result Date: 07/09/2021 ?CLINICAL DATA:  Abdominal pain since 5 p.m. yesterday. EXAM: ABDOMEN ULTRASOUND COMPLETE COMPARISON:  None. FINDINGS: The sonographer reports decreased resolution due to large body  habitus. Gallbladder: There are multiple echogenic shadowing gallstones measuring up to 6 mm. No gallbladder wall thickening pericholecystic fluid or sonographic Murphy's sign. Common bile duct:  Diameter: 4 mm. No intrahepatic biliary ductal dilatation is seen. Liver: Heterogeneous liver parenchyma with increased echogenicity and attenuation suggesting fatty infiltration. There is an area of geographic decreased echogenicity without increased color flow vascularity within the right hepatic lobe near the gallbladder, compatible with fatty sparing commonly seen near the gallbladder fossa. Smooth liver contours. Portal vein is patent on color Doppler imaging with normal direction of blood flow towards the liver. IVC: No abnormality visualized. The infrahepatic IVC is not visualized. Pancreas: Not well visualized due to overlying bowel gas. Spleen: Size and appearance within normal limits. Right Kidney: Length: 10.0 cm. Echogenicity within normal limits. No mass or hydronephrosis visualized. Left Kidney: Length: 10.2 cm. Echogenicity within normal limits. No mass or hydronephrosis visualized. Abdominal aorta: No aneurysm visualized. The distal bifurcation is not visualized due to overlying bowel gas. Other findings: None. IMPRESSION:: IMPRESSION: 1. Cholelithiasis without sonographic evidence of acute cholecystitis. 2. Fatty infiltration of the liver with fatty sparing adjacent to the gallbladder fossa. Electronically Signed   By: Yvonne Kendall M.D.   On: 07/09/2021 13:37   ? ? ? ?Assessment/Plan ?Acute pancreatitis, likely gallstone pancreatitis ?- Afebrile, vitals stable, WBC 11.5 ?- AST 81, ALT 174, alk phos 128, total bilirubin 1.9, lipase 639 ?- Right upper quadrant ultrasound shows cholelithiasis without evidence of cholecystitis, CBD 4 mm ?-Patient has a clinical history of biliary colic, upper abdominal pain after eating spicy foods associated with nausea and vomiting.  Suspect she may have passed a gallstone.   Will order CT of the abdomen and pelvis to further evaluate pancreatitis. ?-Recheck CBC, CMP, and lipase in the morning.  Add on triglycerides. ?- I do think the patient would benefit from laparoscopic cholecyste

## 2021-07-09 NOTE — ED Triage Notes (Addendum)
Pt arrived POV from home c/o generalized abdominal pain x5 years but 2 weeks ago got worse and she can't take it. Pt endorses nausea when she eats.  ?

## 2021-07-09 NOTE — Progress Notes (Signed)
Pt admitted to 6N18 via stretcher from ED, NPO and this was explained to pt who understands. Pt is alert and oriented x4 and describes pain at a level 8, was medicated with Morphine 4 and zofran IV. SCDs applied to pt and instructed her on use of the IS.  ?

## 2021-07-10 LAB — CBC
HCT: 41.8 % (ref 36.0–46.0)
Hemoglobin: 13.8 g/dL (ref 12.0–15.0)
MCH: 28.5 pg (ref 26.0–34.0)
MCHC: 33 g/dL (ref 30.0–36.0)
MCV: 86.2 fL (ref 80.0–100.0)
Platelets: 241 10*3/uL (ref 150–400)
RBC: 4.85 MIL/uL (ref 3.87–5.11)
RDW: 15.2 % (ref 11.5–15.5)
WBC: 9.9 10*3/uL (ref 4.0–10.5)
nRBC: 0 % (ref 0.0–0.2)

## 2021-07-10 LAB — COMPREHENSIVE METABOLIC PANEL
ALT: 113 U/L — ABNORMAL HIGH (ref 0–44)
AST: 33 U/L (ref 15–41)
Albumin: 3 g/dL — ABNORMAL LOW (ref 3.5–5.0)
Alkaline Phosphatase: 98 U/L (ref 38–126)
Anion gap: 4 — ABNORMAL LOW (ref 5–15)
BUN: 5 mg/dL — ABNORMAL LOW (ref 6–20)
CO2: 24 mmol/L (ref 22–32)
Calcium: 8.4 mg/dL — ABNORMAL LOW (ref 8.9–10.3)
Chloride: 109 mmol/L (ref 98–111)
Creatinine, Ser: 0.72 mg/dL (ref 0.44–1.00)
GFR, Estimated: >60 mL/min (ref >60)
Glucose, Bld: 130 mg/dL — ABNORMAL HIGH (ref 70–99)
Potassium: 3.7 mmol/L (ref 3.5–5.1)
Sodium: 137 mmol/L (ref 135–145)
Total Bilirubin: 1.9 mg/dL — ABNORMAL HIGH (ref 0.3–1.2)
Total Protein: 5.9 g/dL — ABNORMAL LOW (ref 6.5–8.1)

## 2021-07-10 LAB — HIV ANTIBODY (ROUTINE TESTING W REFLEX): HIV Screen 4th Generation wRfx: NONREACTIVE

## 2021-07-10 LAB — TRIGLYCERIDES: Triglycerides: 47 mg/dL (ref <150)

## 2021-07-10 LAB — LIPASE, BLOOD: Lipase: 117 U/L — ABNORMAL HIGH (ref 11–51)

## 2021-07-10 MED ORDER — TRAMADOL HCL 50 MG PO TABS
50.0000 mg | ORAL_TABLET | Freq: Four times a day (QID) | ORAL | Status: DC | PRN
  Administered 2021-07-12 (×2): 50 mg via ORAL
  Filled 2021-07-10 (×2): qty 1

## 2021-07-10 NOTE — Plan of Care (Signed)
  Problem: Clinical Measurements: Goal: Will remain free from infection Outcome: Not Progressing   Problem: Elimination: Goal: Will not experience complications related to bowel motility Outcome: Not Progressing   Problem: Pain Managment: Goal: General experience of comfort will improve Outcome: Not Progressing   

## 2021-07-10 NOTE — Progress Notes (Signed)
[facility] ?Progress Note ? ?   ?Subjective: ?CC-  ?Continues to have epigastric/RUQ abdominal pain. Less than yesterday but still severe and requiring morphine. Rates pain as 8/10.  ?Lipase down to 117 ? ?Objective: ?Vital signs in last 24 hours: ?Temp:  [98.6 ?F (37 ?C)-99 ?F (37.2 ?C)] 98.6 ?F (37 ?C) (04/29 2836) ?Pulse Rate:  [76-102] 94 (04/29 0833) ?Resp:  [13-21] 17 (04/29 6294) ?BP: (93-115)/(59-82) 104/63 (04/29 7654) ?SpO2:  [96 %-100 %] 97 % (04/29 0833) ?Last BM Date : 07/06/21 ? ?Intake/Output from previous day: ?04/28 0701 - 04/29 0700 ?In: 134.1 [I.V.:134.1] ?Out: -  ?Intake/Output this shift: ?No intake/output data recorded. ? ?PE: ?Gen:  Alert, NAD, pleasant ?Abd: soft, ND, TTP epigastric and RUQ with voluntary guarding, no peritonitis  ? ?Lab Results:  ?Recent Labs  ?  07/09/21 ?0925 07/10/21 ?0111  ?WBC 11.5* 9.9  ?HGB 14.4 13.8  ?HCT 44.3 41.8  ?PLT 283 241  ? ?BMET ?Recent Labs  ?  07/09/21 ?0925 07/10/21 ?0111  ?NA 137 137  ?K 3.5 3.7  ?CL 108 109  ?CO2 23 24  ?GLUCOSE 115* 130*  ?BUN 7 5*  ?CREATININE 0.77 0.72  ?CALCIUM 8.6* 8.4*  ? ?PT/INR ?No results for input(s): LABPROT, INR in the last 72 hours. ?CMP  ?   ?Component Value Date/Time  ? NA 137 07/10/2021 0111  ? K 3.7 07/10/2021 0111  ? CL 109 07/10/2021 0111  ? CO2 24 07/10/2021 0111  ? GLUCOSE 130 (H) 07/10/2021 0111  ? BUN 5 (L) 07/10/2021 0111  ? CREATININE 0.72 07/10/2021 0111  ? CALCIUM 8.4 (L) 07/10/2021 0111  ? PROT 5.9 (L) 07/10/2021 0111  ? ALBUMIN 3.0 (L) 07/10/2021 0111  ? AST 33 07/10/2021 0111  ? ALT 113 (H) 07/10/2021 0111  ? ALKPHOS 98 07/10/2021 0111  ? BILITOT 1.9 (H) 07/10/2021 0111  ? GFRNONAA >60 07/10/2021 0111  ? ?Lipase  ?   ?Component Value Date/Time  ? LIPASE 117 (H) 07/10/2021 0111  ? ? ? ? ? ?Studies/Results: ?US Abdomen Complete ? ?Result Date: 07/09/2021 ?CLINICAL DATA:  Abdominal pain since 5 p.m. yesterday. EXAM: ABDOMEN ULTRASOUND COMPLETE COMPARISON:  None. FINDINGS: The sonographer reports  decreased resolution due to large body habitus. Gallbladder: There are multiple echogenic shadowing gallstones measuring up to 6 mm. No gallbladder wall thickening pericholecystic fluid or sonographic Murphy's sign. Common bile duct: Diameter: 4 mm. No intrahepatic biliary ductal dilatation is seen. Liver: Heterogeneous liver parenchyma with increased echogenicity and attenuation suggesting fatty infiltration. There is an area of geographic decreased echogenicity without increased color flow vascularity within the right hepatic lobe near the gallbladder, compatible with fatty sparing commonly seen near the gallbladder fossa. Smooth liver contours. Portal vein is patent on color Doppler imaging with normal direction of blood flow towards the liver. IVC: No abnormality visualized. The infrahepatic IVC is not visualized. Pancreas: Not well visualized due to overlying bowel gas. Spleen: Size and appearance within normal limits. Right Kidney: Length: 10.0 cm. Echogenicity within normal limits. No mass or hydronephrosis visualized. Left Kidney: Length: 10.2 cm. Echogenicity within normal limits. No mass or hydronephrosis visualized. Abdominal aorta: No aneurysm visualized. The distal bifurcation is not visualized due to overlying bowel gas. Other findings: None. IMPRESSION:: IMPRESSION: 1. Cholelithiasis without sonographic evidence of acute cholecystitis. 2. Fatty infiltration of the liver with fatty sparing adjacent to the gallbladder fossa. Electronically Signed   By: Neita Garnet M.D.   On: 07/09/2021 13:37  ? ?CT ABDOMEN PELVIS W  CONTRAST ? ?Result Date: 07/09/2021 ?CLINICAL DATA:  Pancreatitis, acute, initial episode (Ped 0-17y) Abdominal pain, acute, nonlocalized EXAM: CT ABDOMEN AND PELVIS WITH CONTRAST TECHNIQUE: Multidetector CT imaging of the abdomen and pelvis was performed using the standard protocol following bolus administration of intravenous contrast. RADIATION DOSE REDUCTION: This exam was performed  according to the departmental dose-optimization program which includes automated exposure control, adjustment of the mA and/or kV according to patient size and/or use of iterative reconstruction technique. CONTRAST:  OMNIPAQUE IOHEXOL 300 MG/ML  SOLN COMPARISON:  Same day ultrasound FINDINGS: Lower chest: Heart size is normal.  Mild bibasilar atelectasis. Hepatobiliary: Mildly decreased attenuation of the hepatic parenchyma. No focal liver lesion is identified. Gallbladder is mildly distended. Gallstones seen on previous ultrasound are not evident by CT. Common bile duct is mildly dilated at 10 mm. No intrahepatic biliary dilatation. Pancreas: Mildly enlarged, edematous appearance of the pancreas with peripancreatic fat stranding and ill-defined fluid. No organized or walled off fluid collections. No area of pancreatic parenchymal non enhancement. No ductal dilatation. Spleen: Normal in size without focal abnormality. Adrenals/Urinary Tract: Unremarkable adrenal glands. Kidneys enhance symmetrically without focal lesion, stone, or hydronephrosis. Ureters are nondilated. Urinary bladder appears unremarkable. Stomach/Bowel: Stomach is within normal limits. Appendix appears normal. No evidence of bowel wall thickening, distention, or inflammatory changes. Vascular/Lymphatic: No significant vascular findings are present. Scattered nonenlarged retroperitoneal and mesenteric lymph nodes, likely reactive. No lymphadenopathy. Reproductive: Uterus and bilateral adnexa are unremarkable. Other: No organized abdominopelvic fluid collection. No pneumoperitoneum. Tiny fat containing umbilical hernia. Musculoskeletal: No acute or significant osseous findings. IMPRESSION: 1. Acute uncomplicated pancreatitis. 2. Mildly distended gallbladder and mildly dilated common bile duct at 10 mm. No intrahepatic biliary dilatation. 3. Mild hepatic steatosis. Electronically Signed   By: Duanne Guess D.O.   On: 07/09/2021 16:15    ? ?Anti-infectives: ?Anti-infectives (From admission, onward)  ? ? None  ? ?  ? ? ? ?Assessment/Plan ?Acute pancreatitis, likely gallstone pancreatitis ?- Right upper quadrant ultrasound shows cholelithiasis without evidence of cholecystitis, CBD 4 mm ?- CT shows mildly enlarged, edematous appearance of the pancreas with peripancreatic fat stranding and ill-defined fluid, no organized or walled off fluid collections. No area of pancreatic parenchymal non enhancement. No ductal dilatation ?- Lipase trending down (117) but patient is still fairly tender on abdominal exam and requiring IV morphine. Not ready for cholecystectomy. Continue IVF and supportive care. Ok for clear liquids. NPO after midnight. Repeat labs in AM. ?Also of note her Tbili remains elevated at 1.9, not rising. May need to consider MRCP if trends up. ?  ?FEN -CLD, IVF @ 125 cc/h ?VTE -SCDs, Lovenox ?ID -none currently  ?  ?I reviewed nursing notes, last 24 h vitals and pain scores, last 48 h intake and output, last 24 h labs and trends (CBC, CMP, lipase), and last 24 h imaging results (CT abdomen/pelvis and u/s from yesterday) ? ? LOS: 1 day  ? ? ?Franne Forts, PA-C ?[facility] ?07/10/2021, 10:49 AM ?Please see Amion for pager number during day hours 7:00am-4:30pm ? ?

## 2021-07-11 ENCOUNTER — Encounter (HOSPITAL_COMMUNITY): Payer: Self-pay

## 2021-07-11 ENCOUNTER — Other Ambulatory Visit: Payer: Self-pay

## 2021-07-11 ENCOUNTER — Encounter (HOSPITAL_COMMUNITY): Admission: EM | Disposition: A | Discharge status: Home / Self Care | Payer: Self-pay | Source: Home / Self Care

## 2021-07-11 ENCOUNTER — Inpatient Hospital Stay (HOSPITAL_COMMUNITY): Payer: MEDICAID

## 2021-07-11 ENCOUNTER — Inpatient Hospital Stay (HOSPITAL_COMMUNITY): Payer: MEDICAID | Admitting: Anesthesiology

## 2021-07-11 DIAGNOSIS — K8070 Calculus of gallbladder and bile duct without cholecystitis without obstruction: Secondary | ICD-10-CM

## 2021-07-11 DIAGNOSIS — K851 Biliary acute pancreatitis without necrosis or infection: Secondary | ICD-10-CM

## 2021-07-11 HISTORY — PX: CHOLECYSTECTOMY: SHX55

## 2021-07-11 LAB — COMPREHENSIVE METABOLIC PANEL
ALT: 78 U/L — ABNORMAL HIGH (ref 0–44)
AST: 22 U/L (ref 15–41)
Albumin: 2.9 g/dL — ABNORMAL LOW (ref 3.5–5.0)
Alkaline Phosphatase: 91 U/L (ref 38–126)
Anion gap: 7 (ref 5–15)
BUN: <5 mg/dL — ABNORMAL LOW (ref 6–20)
CO2: 22 mmol/L (ref 22–32)
Calcium: 8.2 mg/dL — ABNORMAL LOW (ref 8.9–10.3)
Chloride: 106 mmol/L (ref 98–111)
Creatinine, Ser: 0.76 mg/dL (ref 0.44–1.00)
GFR, Estimated: >60 mL/min (ref >60)
Glucose, Bld: 121 mg/dL — ABNORMAL HIGH (ref 70–99)
Potassium: 3.5 mmol/L (ref 3.5–5.1)
Sodium: 135 mmol/L (ref 135–145)
Total Bilirubin: 2 mg/dL — ABNORMAL HIGH (ref 0.3–1.2)
Total Protein: 6 g/dL — ABNORMAL LOW (ref 6.5–8.1)

## 2021-07-11 LAB — CBC
HCT: 40.2 % (ref 36.0–46.0)
Hemoglobin: 13.1 g/dL (ref 12.0–15.0)
MCH: 27.9 pg (ref 26.0–34.0)
MCHC: 32.6 g/dL (ref 30.0–36.0)
MCV: 85.5 fL (ref 80.0–100.0)
Platelets: 261 10*3/uL (ref 150–400)
RBC: 4.7 MIL/uL (ref 3.87–5.11)
RDW: 14.6 % (ref 11.5–15.5)
WBC: 11.5 10*3/uL — ABNORMAL HIGH (ref 4.0–10.5)
nRBC: 0 % (ref 0.0–0.2)

## 2021-07-11 LAB — LIPASE, BLOOD: Lipase: 45 U/L (ref 11–51)

## 2021-07-11 SURGERY — LAPAROSCOPIC CHOLECYSTECTOMY WITH INTRAOPERATIVE CHOLANGIOGRAM
Anesthesia: General

## 2021-07-11 MED ORDER — MIDAZOLAM HCL 2 MG/2ML IJ SOLN
INTRAMUSCULAR | Status: AC
  Filled 2021-07-11: qty 2

## 2021-07-11 MED ORDER — MIDAZOLAM HCL 2 MG/2ML IJ SOLN
INTRAMUSCULAR | Status: DC | PRN
  Administered 2021-07-11: 2 mg via INTRAVENOUS

## 2021-07-11 MED ORDER — ONDANSETRON HCL 4 MG/2ML IJ SOLN
INTRAMUSCULAR | Status: DC | PRN
  Administered 2021-07-11: 4 mg via INTRAVENOUS

## 2021-07-11 MED ORDER — PROPOFOL 10 MG/ML IV BOLUS
INTRAVENOUS | Status: DC | PRN
Start: 2021-07-11 — End: 2021-07-11
  Administered 2021-07-11: 150 mg via INTRAVENOUS

## 2021-07-11 MED ORDER — IOHEXOL 300 MG/ML  SOLN
INTRAMUSCULAR | Status: DC | PRN
  Administered 2021-07-11: 20 mL

## 2021-07-11 MED ORDER — CEFAZOLIN SODIUM-DEXTROSE 1-4 GM/50ML-% IV SOLN
1.0000 g | Freq: Once | INTRAVENOUS | Status: AC
  Administered 2021-07-11: 1 g via INTRAVENOUS
  Filled 2021-07-11 (×2): qty 50

## 2021-07-11 MED ORDER — DEXAMETHASONE SODIUM PHOSPHATE 10 MG/ML IJ SOLN
INTRAMUSCULAR | Status: DC | PRN
  Administered 2021-07-11: 5 mg via INTRAVENOUS

## 2021-07-11 MED ORDER — PHENYLEPHRINE 80 MCG/ML (10ML) SYRINGE FOR IV PUSH (FOR BLOOD PRESSURE SUPPORT)
PREFILLED_SYRINGE | INTRAVENOUS | Status: DC | PRN
  Administered 2021-07-11 (×3): 80 ug via INTRAVENOUS

## 2021-07-11 MED ORDER — HEMOSTATIC AGENTS (NO CHARGE) OPTIME
TOPICAL | Status: DC | PRN
  Administered 2021-07-11: 1 via TOPICAL

## 2021-07-11 MED ORDER — HYDROMORPHONE HCL 1 MG/ML IJ SOLN
INTRAMUSCULAR | Status: AC
  Filled 2021-07-11: qty 0.5

## 2021-07-11 MED ORDER — 0.9 % SODIUM CHLORIDE (POUR BTL) OPTIME
TOPICAL | Status: DC | PRN
  Administered 2021-07-11: 1000 mL

## 2021-07-11 MED ORDER — FENTANYL CITRATE (PF) 250 MCG/5ML IJ SOLN
INTRAMUSCULAR | Status: AC
  Filled 2021-07-11: qty 5

## 2021-07-11 MED ORDER — LACTATED RINGERS IV SOLN: INTRAVENOUS | Status: DC

## 2021-07-11 MED ORDER — DEXAMETHASONE SODIUM PHOSPHATE 10 MG/ML IJ SOLN
INTRAMUSCULAR | Status: AC
  Filled 2021-07-11: qty 1

## 2021-07-11 MED ORDER — CHLORHEXIDINE GLUCONATE 0.12 % MT SOLN
OROMUCOSAL | Status: AC
Start: 2021-07-11 — End: 2021-07-11
  Administered 2021-07-11: 15 mL via OROMUCOSAL
  Filled 2021-07-11: qty 15

## 2021-07-11 MED ORDER — PROPOFOL 10 MG/ML IV BOLUS
INTRAVENOUS | Status: AC
  Filled 2021-07-11: qty 20

## 2021-07-11 MED ORDER — ORAL CARE MOUTH RINSE: 15.0000 mL | Freq: Once | OROMUCOSAL | Status: AC

## 2021-07-11 MED ORDER — ROCURONIUM BROMIDE 10 MG/ML (PF) SYRINGE
PREFILLED_SYRINGE | INTRAVENOUS | Status: DC | PRN
  Administered 2021-07-11: 50 mg via INTRAVENOUS
  Administered 2021-07-11: 30 mg via INTRAVENOUS

## 2021-07-11 MED ORDER — LIDOCAINE 2% (20 MG/ML) 5 ML SYRINGE
INTRAMUSCULAR | Status: DC | PRN
  Administered 2021-07-11: 60 mg via INTRAVENOUS

## 2021-07-11 MED ORDER — PHENYLEPHRINE 80 MCG/ML (10ML) SYRINGE FOR IV PUSH (FOR BLOOD PRESSURE SUPPORT)
PREFILLED_SYRINGE | INTRAVENOUS | Status: AC
  Filled 2021-07-11: qty 10

## 2021-07-11 MED ORDER — FENTANYL CITRATE (PF) 100 MCG/2ML IJ SOLN: 25.0000 ug | INTRAMUSCULAR | Status: DC | PRN

## 2021-07-11 MED ORDER — HYDROMORPHONE HCL 1 MG/ML IJ SOLN
INTRAMUSCULAR | Status: DC | PRN
  Administered 2021-07-11: .5 mg via INTRAVENOUS

## 2021-07-11 MED ORDER — FENTANYL CITRATE (PF) 250 MCG/5ML IJ SOLN
INTRAMUSCULAR | Status: DC | PRN
  Administered 2021-07-11: 100 ug via INTRAVENOUS
  Administered 2021-07-11 (×3): 50 ug via INTRAVENOUS

## 2021-07-11 MED ORDER — SODIUM CHLORIDE 0.9 % IR SOLN
Status: DC | PRN
  Administered 2021-07-11: 1000 mL

## 2021-07-11 MED ORDER — BUPIVACAINE HCL 0.25 % IJ SOLN
INTRAMUSCULAR | Status: DC | PRN
  Administered 2021-07-11: 30 mL

## 2021-07-11 MED ORDER — CHLORHEXIDINE GLUCONATE 0.12 % MT SOLN: 15.0000 mL | Freq: Once | OROMUCOSAL | Status: AC

## 2021-07-11 MED ORDER — ROCURONIUM BROMIDE 10 MG/ML (PF) SYRINGE
PREFILLED_SYRINGE | INTRAVENOUS | Status: AC
  Filled 2021-07-11: qty 10

## 2021-07-11 MED ORDER — AMISULPRIDE (ANTIEMETIC) 5 MG/2ML IV SOLN: 10.0000 mg | Freq: Once | INTRAVENOUS | Status: DC | PRN

## 2021-07-11 MED ORDER — LIDOCAINE 2% (20 MG/ML) 5 ML SYRINGE
INTRAMUSCULAR | Status: AC
  Filled 2021-07-11: qty 5

## 2021-07-11 MED ORDER — SUGAMMADEX SODIUM 200 MG/2ML IV SOLN
INTRAVENOUS | Status: DC | PRN
  Administered 2021-07-11: 200 mg via INTRAVENOUS

## 2021-07-11 MED ORDER — ONDANSETRON HCL 4 MG/2ML IJ SOLN
INTRAMUSCULAR | Status: AC
  Filled 2021-07-11: qty 2

## 2021-07-11 MED ORDER — BUPIVACAINE HCL (PF) 0.25 % IJ SOLN
INTRAMUSCULAR | Status: AC
  Filled 2021-07-11: qty 30

## 2021-07-11 SURGICAL SUPPLY — 50 items
APPLIER CLIP ROT 10 11.4 M/L (STAPLE) ×2
BAG COUNTER SPONGE SURGICOUNT (BAG) ×2 IMPLANT
CANISTER SUCT 3000ML PPV (MISCELLANEOUS) ×2 IMPLANT
CATH URET 5FR 28IN OPEN ENDED (CATHETERS) ×3 IMPLANT
CHLORAPREP W/TINT 26 (MISCELLANEOUS) ×2 IMPLANT
CLIP APPLIE ROT 10 11.4 M/L (STAPLE) ×1 IMPLANT
COVER MAYO STAND STRL (DRAPES) ×1 IMPLANT
COVER SURGICAL LIGHT HANDLE (MISCELLANEOUS) ×2 IMPLANT
DERMABOND ADVANCED (GAUZE/BANDAGES/DRESSINGS) ×1
DERMABOND ADVANCED .7 DNX12 (GAUZE/BANDAGES/DRESSINGS) ×1 IMPLANT
DRAPE C-ARM 42X120 X-RAY (DRAPES) ×2 IMPLANT
ELECT REM PT RETURN 9FT ADLT (ELECTROSURGICAL) ×2
ELECTRODE REM PT RTRN 9FT ADLT (ELECTROSURGICAL) ×1 IMPLANT
ENDOLOOP SUT PDS II  0 18 (SUTURE) ×1
ENDOLOOP SUT PDS II 0 18 (SUTURE) IMPLANT
GLOVE BIO SURGEON STRL SZ7.5 (GLOVE) ×2 IMPLANT
GLOVE BIOGEL PI IND STRL 8 (GLOVE) ×1 IMPLANT
GLOVE BIOGEL PI INDICATOR 8 (GLOVE) ×1
GOWN STRL REUS W/ TWL LRG LVL3 (GOWN DISPOSABLE) ×2 IMPLANT
GOWN STRL REUS W/ TWL XL LVL3 (GOWN DISPOSABLE) ×1 IMPLANT
GOWN STRL REUS W/TWL LRG LVL3 (GOWN DISPOSABLE) ×2
GOWN STRL REUS W/TWL XL LVL3 (GOWN DISPOSABLE) ×1
GRASPER SUT TROCAR 14GX15 (MISCELLANEOUS) ×2 IMPLANT
HEMOSTAT SNOW SURGICEL 2X4 (HEMOSTASIS) ×1 IMPLANT
IRRIG SUCT STRYKERFLOW 2 WTIP (MISCELLANEOUS) ×2
IRRIGATION SUCT STRKRFLW 2 WTP (MISCELLANEOUS) ×1 IMPLANT
IV CATH 14GX2 1/4 (CATHETERS) ×1 IMPLANT
IV CATH AUTO 14GX1.75 SAFE ORG (IV SOLUTION) ×2 IMPLANT
KIT BASIN OR (CUSTOM PROCEDURE TRAY) ×2 IMPLANT
KIT TURNOVER KIT B (KITS) ×2 IMPLANT
NDL INSUFFLATION 14GA 120MM (NEEDLE) ×1 IMPLANT
NEEDLE INSUFFLATION 14GA 120MM (NEEDLE) ×2 IMPLANT
NS IRRIG 1000ML POUR BTL (IV SOLUTION) ×2 IMPLANT
PAD ARMBOARD 7.5X6 YLW CONV (MISCELLANEOUS) ×2 IMPLANT
POUCH RETRIEVAL ECOSAC 10 (ENDOMECHANICALS) ×1 IMPLANT
POUCH RETRIEVAL ECOSAC 10MM (ENDOMECHANICALS) ×1
SCISSORS LAP 5X35 DISP (ENDOMECHANICALS) ×2 IMPLANT
SET CHOLANGIOGRAPH 5 50 .035 (SET/KITS/TRAYS/PACK) ×1 IMPLANT
SET TUBE SMOKE EVAC HIGH FLOW (TUBING) ×2 IMPLANT
SLEEVE ENDOPATH XCEL 5M (ENDOMECHANICALS) ×4 IMPLANT
SPECIMEN JAR SMALL (MISCELLANEOUS) ×2 IMPLANT
STOPCOCK 4 WAY LG BORE MALE ST (IV SETS) ×2 IMPLANT
SUT MNCRL AB 4-0 PS2 18 (SUTURE) ×2 IMPLANT
TOWEL GREEN STERILE (TOWEL DISPOSABLE) ×2 IMPLANT
TOWEL GREEN STERILE FF (TOWEL DISPOSABLE) ×2 IMPLANT
TRAY LAPAROSCOPIC MC (CUSTOM PROCEDURE TRAY) ×2 IMPLANT
TROCAR XCEL NON-BLD 11X100MML (ENDOMECHANICALS) ×2 IMPLANT
TROCAR XCEL NON-BLD 5MMX100MML (ENDOMECHANICALS) ×2 IMPLANT
WARMER LAPAROSCOPE (MISCELLANEOUS) ×1 IMPLANT
WATER STERILE IRR 1000ML POUR (IV SOLUTION) ×1 IMPLANT

## 2021-07-11 NOTE — Transfer of Care (Signed)
Immediate Anesthesia Transfer of Care Note ? ?Patient: [facility] ? ?Procedure(s) Performed: LAPAROSCOPIC CHOLECYSTECTOMY WITH INTRAOPERATIVE CHOLANGIOGRAM ? ?Patient Location: PACU ? ?Anesthesia Type:General ? ?Level of Consciousness: awake, alert  and oriented ? ?Airway & Oxygen Therapy: Patient Spontanous Breathing ? ?Post-op Assessment: Report given to RN, Post -op Vital signs reviewed and stable and Patient moving all extremities ? ?Post vital signs: Reviewed and stable ? ?Last Vitals:  ?Vitals Value Taken Time  ?BP 106/61 07/11/21 1028  ?Temp    ?Pulse 106 07/11/21 1029  ?Resp 13 07/11/21 1029  ?SpO2 91 % 07/11/21 1029  ?Vitals shown include unvalidated device data. ? ?Last Pain:  ?Vitals:  ? 07/11/21 0822  ?TempSrc: Oral  ?PainSc:   ?   ? ?Patients Stated Pain Goal: 1 (07/10/21 0317) ? ?Complications: No notable events documented. ?

## 2021-07-11 NOTE — Anesthesia Postprocedure Evaluation (Signed)
Anesthesia Post Note ? ?Patient: [facility] ? ?Procedure(s) Performed: LAPAROSCOPIC CHOLECYSTECTOMY WITH INTRAOPERATIVE CHOLANGIOGRAM ? ?  ? ?Patient location during evaluation: PACU ?Anesthesia Type: General ?Level of consciousness: awake and alert ?Pain management: pain level controlled ?Vital Signs Assessment: post-procedure vital signs reviewed and stable ?Respiratory status: spontaneous breathing, nonlabored ventilation, respiratory function stable and patient connected to nasal cannula oxygen ?Cardiovascular status: blood pressure returned to baseline and stable ?Postop Assessment: no apparent nausea or vomiting ?Anesthetic complications: no ? ? ?No notable events documented. ? ?Last Vitals:  ?Vitals:  ? 07/11/21 1856 07/11/21 2112  ?BP:  111/77  ?Pulse: (!) 107 (!) 106  ?Resp:  18  ?Temp: 37.1 ?C 36.8 ?C  ?SpO2: 94% 93%  ?  ?Last Pain:  ?Vitals:  ? 07/11/21 2112  ?TempSrc: Oral  ?PainSc:   ? ? ?  ?  ?  ?  ?  ?  ? ?Marcene Duos E ? ? ? ? ?

## 2021-07-11 NOTE — Op Note (Signed)
? ?Patient: Kendra Fitzgerald ([birth year], [id number]) ? ?Date of Surgery: 07/11/2021  ? ?Preoperative Diagnosis: Gallstone pancreatitis ? ?Postoperative Diagnosis: Gallstone pancreatitis, choledocholith ? ?Surgical Procedure: LAPAROSCOPIC CHOLECYSTECTOMY WITH INTRAOPERATIVE CHOLANGIOGRAM: [id number] (CPT?)  ? ?Operative Team Members:  ?Surgeon(s) and Role: ?   * Pau Banh, Nickola Major, MD - Primary  ? ?Anesthesiologist: Suzette Battiest, MD ?CRNA: Amadeo Garnet, CRNA; Vonna Drafts, CRNA  ? ?Anesthesia: General  ? ?Fluids:  ?Total I/O ?In: 12 [IV Piggyback:50] ?Out: -  ? ?Complications: None ? ?Drains:  none  ? ?Specimen:  ?ID Type Source Tests Collected by Time Destination  ?1 : Gallblader Tissue PATH Gallbladder SURGICAL PATHOLOGY Kimberlly Norgard, Nickola Major, MD 07/11/2021 [phone]   ?  ? ?Disposition:  PACU - hemodynamically stable. ? ?Plan of Care:  Continued care on floor ? ? ? ?Indications for Procedure: Kendra Fitzgerald is a 36 y.o. female who presented with abdominal pain.  History, physical and imaging was concerning for gallstone pancreatitis.  She was admitted to the surgery service, treated for pancreatitis and had clinical resolution of her pancreatitis.  Laparoscopic cholecystectomy with intra-operative cholangiogram was recommended for the patient.  The procedure itself, as well as the risks, benefits and alternatives were discussed with the patient.  Risks discussed included but were not limited to the risk of infection, bleeding, damage to nearby structures, need to convert to open procedure, incisional hernia, bile leak, common bile duct injury and the need for additional procedures or surgeries.  With this discussion complete and all questions answered the patient granted consent to proceed. ? ?Findings: Choledocholith, partially obstructing, near the ampula, flushing improved appearance of cholangiogram - some of the filling defect appeared to resolve, and contrast flows easily into the duodenum.   However the duct did not taper gently into the duodenum.  Recommend rechecking LFTs in the morning to determine if ERCP may be needed. ? ?Infection status: ?Patient: Kendra Fitzgerald [facility] Service Patient ?Case: Urgent ?Infection Present At Time Of Surgery (PATOS):  Infected gallbladder, spillage of bile related to performing a cholangiogram. ? ? ?Description of Procedure:  ? ?On the date stated above, the patient was taken to the operating room suite and placed in supine positioning.  Sequential compression devices were placed on the lower extremities to prevent blood clots.  General endotracheal anesthesia was induced. Preoperative antibiotics (cefazolin) were given within 30 minutes of incision.  The patient's abdomen was prepped and draped in the usual sterile fashion.  A time-out was completed verifying the correct patient, procedure, positioning and equipment needed for the case. ? ?We began by anesthetizing the skin with local anesthetic and then making a 5 mm incision just below the umbilicus.  We dissected through the subcutaneous tissues to the fascia.  The fascia was grasped and elevated using a Kocher clamp.  A Veress needle was inserted into the abdomen and the abdomen was insufflated to 15 mmHg.  A 5 mm trocar was inserted in this position under optical guidance and then the abdomen was inspected.  There was no trauma to the underlying viscera with initial trocar placement.  Any abnormal findings, other than inflammation in the right upper quadrant, are listed above in the findings section.  Three additional trocars were placed, one 12 mm trocar in the subxiphoid position, one 5 mm trocar in the midline epigastric area and one 67mm trocar in the right upper quadrant subcostally.  These were placed under direct vision without any trauma to the underlying viscera.   ? ?  The patient was then placed in head up, left side down positioning.  The gallbladder was identified and dissected free from  its attachments to the omentum allowing the duodenum to fall away.  The infundibulum of the gallbladder was dissected free working laterally to medially.  The cystic duct and cystic artery were dissected free from surrounding connective tissue.  The infundibulum of the gallbladder was dissected off the cystic plate.  A critical view of safety was obtained with the cystic duct and cystic artery being cleared of connective tissues and clearly the only two structures entering into the gallbladder with the liver clearly visible behind.  One clip was applied high on the cystic duct.  A small ductotomy was created below this using the endoscopic shears.  A cholangiogram catheter was introduced through the abdominal wall and into the cystic duct through this ductotomy.  The catheter was clipped into position.  The catheter was flushed to ensure no leakage around the clip.  We then removed the laparoscopic instruments and positioned the C-Arm to perform a cholangiogram.  The catheter was flushed with contrast under fluoroscopic visualization and a cholangiogram was obtained.  At first there was a filling defect in the common bile duct down toward the ampulla, and it appeared that this stone passed into the duodenum.  The catheter was flushed with 20 mL of saline, and a second cholangiogram was performed.  This is the cholangiogram that was saved in the chart.  The cholangiogram visualized the biliary tree from the ampulla up to the first two biliary radicals in the liver.  There were no clear filling defects identified.  The catheter clearly entered the cystic duct.  There was not gradual tapering of the common bile duct down to the ampulla, was very little resistance and easy flow of contrast into the duodenum..  Please see the EMR for saved representative images.  With our cholangiogram compete, we moved the c-arm away from the field and returned to laparoscopic surgery.   ? ?Clips were then applied to the cystic duct and  cystic artery and then these structures were divided.  The gallbladder was dissected off the cystic plate.  A posterior branch of the cystic artery was encountered and clipped during this part of the dissection.  The gallbladder was placed in an endocatch bag and removed from the 12 mm subxiphoid port site.  The clips were inspected and appeared effective.  The cystic plate was inspected and hemostasis was obtained using electrocautery.  A piece of snow topical hemostatic agent was placed in the gallbladder fossa for hemostasis.  A suction irrigator was used to clean the operative field.  Attention was turned to closure.  The 12 mm subxiphoid port site was closed using a 0-vicryl suture on a fascial suture passer.  The abdomen was desufflated.  The skin was closed using 4-0 monocryl and dermabond.  All sponge and needle counts were correct at the conclusion of the case. ? ? ? ?Louanna Raw, MD ?General, Bariatric, & Minimally Invasive Surgery ?[facility], [place] ? ?

## 2021-07-11 NOTE — Anesthesia Preprocedure Evaluation (Signed)
Anesthesia Evaluation  ?Patient identified by MRN, date of birth, ID band ?Patient awake ? ? ? ?Reviewed: ?Allergy & Precautions, NPO status , Patient's Chart, lab work & pertinent test results ? ?Airway ?Mallampati: II ? ?TM Distance: >3 FB ?Neck ROM: Full ? ? ? Dental ?  ?Pulmonary ?neg pulmonary ROS,  ?  ?breath sounds clear to auscultation ? ? ? ? ? ? Cardiovascular ?negative cardio ROS ? ? ?Rhythm:Regular Rate:Normal ? ? ?  ?Neuro/Psych ?negative neurological ROS ?   ? GI/Hepatic ?Neg liver ROS, GERD  ,  ?Endo/Other  ?negative endocrine ROS ? Renal/GU ?negative Renal ROS  ? ?  ?Musculoskeletal ? ? Abdominal ?  ?Peds ? Hematology ?negative hematology ROS ?(+)   ?Anesthesia Other Findings ? ? Reproductive/Obstetrics ? ?  ? ? ? ? ? ? ? ? ? ? ? ? ? ?  ?  ? ? ? ? ? ? ? ? ?Anesthesia Physical ?Anesthesia Plan ? ?ASA: 2 ? ?Anesthesia Plan: General  ? ?Post-op Pain Management: Tylenol PO (pre-op)* and Toradol IV (intra-op)*  ? ?Induction: Intravenous ? ?PONV Risk Score and Plan: 3 and Dexamethasone, Ondansetron, Midazolam and Treatment may vary due to age or medical condition ? ?Airway Management Planned: Oral ETT ? ?Additional Equipment: None ? ?Intra-op Plan:  ? ?Post-operative Plan: Extubation in OR ? ?Informed Consent: I have reviewed the patients History and Physical, chart, labs and discussed the procedure including the risks, benefits and alternatives for the proposed anesthesia with the patient or authorized representative who has indicated his/her understanding and acceptance.  ? ? ? ?Dental advisory given ? ?Plan Discussed with: CRNA ? ?Anesthesia Plan Comments:   ? ? ? ? ? ? ?Anesthesia Quick Evaluation ? ?

## 2021-07-11 NOTE — Progress Notes (Signed)
[facility] ?Progress Note ? ?   ?Subjective: ?CC-  ?Pain resolved ? ?Lipase down to normal 45 ? ? ?Objective: ?Vital signs in last 24 hours: ?Temp:  [98.3 ?F (36.8 ?C)-99.3 ?F (37.4 ?C)] 98.3 ?F (36.8 ?C) (04/30 0525) ?Pulse Rate:  [94-108] 95 (04/30 0525) ?Resp:  [16-18] 17 (04/30 0525) ?BP: (104-115)/(63-78) 105/68 (04/30 0525) ?SpO2:  [91 %-98 %] 98 % (04/30 0525) ?Last BM Date : 07/06/21 ? ?Intake/Output from previous day: ?04/29 0701 - 04/30 0700 ?In: 515 [P.O.:515] ?Out: 3 [Urine:2; Stool:1] ?Intake/Output this shift: ?No intake/output data recorded. ? ?PE: ?Gen:  Alert, NAD, pleasant ?Abd: soft, ND, non-tender ? ?Lab Results:  ?Recent Labs  ?  07/10/21 ?0111 07/11/21 ?0040  ?WBC 9.9 11.5*  ?HGB 13.8 13.1  ?HCT 41.8 40.2  ?PLT 241 261  ? ? ?BMET ?Recent Labs  ?  07/10/21 ?0111 07/11/21 ?0040  ?NA 137 135  ?K 3.7 3.5  ?CL 109 106  ?CO2 24 22  ?GLUCOSE 130* 121*  ?BUN 5* <5*  ?CREATININE 0.72 0.76  ?CALCIUM 8.4* 8.2*  ? ? ?PT/INR ?No results for input(s): LABPROT, INR in the last 72 hours. ?CMP  ?   ?Component Value Date/Time  ? NA 135 07/11/2021 0040  ? K 3.5 07/11/2021 0040  ? CL 106 07/11/2021 0040  ? CO2 22 07/11/2021 0040  ? GLUCOSE 121 (H) 07/11/2021 0040  ? BUN <5 (L) 07/11/2021 0040  ? CREATININE 0.76 07/11/2021 0040  ? CALCIUM 8.2 (L) 07/11/2021 0040  ? PROT 6.0 (L) 07/11/2021 0040  ? ALBUMIN 2.9 (L) 07/11/2021 0040  ? AST 22 07/11/2021 0040  ? ALT 78 (H) 07/11/2021 0040  ? ALKPHOS 91 07/11/2021 0040  ? BILITOT 2.0 (H) 07/11/2021 0040  ? GFRNONAA >60 07/11/2021 0040  ? ?Lipase  ?   ?Component Value Date/Time  ? LIPASE 45 07/11/2021 0040  ? ? ? ? ? ?Studies/Results: ?US Abdomen Complete ? ?Result Date: 07/09/2021 ?CLINICAL DATA:  Abdominal pain since 5 p.m. yesterday. EXAM: ABDOMEN ULTRASOUND COMPLETE COMPARISON:  None. FINDINGS: The sonographer reports decreased resolution due to large body habitus. Gallbladder: There are multiple echogenic shadowing gallstones measuring up to 6 mm. No  gallbladder wall thickening pericholecystic fluid or sonographic Murphy's sign. Common bile duct: Diameter: 4 mm. No intrahepatic biliary ductal dilatation is seen. Liver: Heterogeneous liver parenchyma with increased echogenicity and attenuation suggesting fatty infiltration. There is an area of geographic decreased echogenicity without increased color flow vascularity within the right hepatic lobe near the gallbladder, compatible with fatty sparing commonly seen near the gallbladder fossa. Smooth liver contours. Portal vein is patent on color Doppler imaging with normal direction of blood flow towards the liver. IVC: No abnormality visualized. The infrahepatic IVC is not visualized. Pancreas: Not well visualized due to overlying bowel gas. Spleen: Size and appearance within normal limits. Right Kidney: Length: 10.0 cm. Echogenicity within normal limits. No mass or hydronephrosis visualized. Left Kidney: Length: 10.2 cm. Echogenicity within normal limits. No mass or hydronephrosis visualized. Abdominal aorta: No aneurysm visualized. The distal bifurcation is not visualized due to overlying bowel gas. Other findings: None. IMPRESSION:: IMPRESSION: 1. Cholelithiasis without sonographic evidence of acute cholecystitis. 2. Fatty infiltration of the liver with fatty sparing adjacent to the gallbladder fossa. Electronically Signed   By: Neita Garnet M.D.   On: 07/09/2021 13:37  ? ?CT ABDOMEN PELVIS W CONTRAST ? ?Result Date: 07/09/2021 ?CLINICAL DATA:  Pancreatitis, acute, initial episode (Ped 0-17y) Abdominal pain, acute, nonlocalized EXAM: CT ABDOMEN AND  PELVIS WITH CONTRAST TECHNIQUE: Multidetector CT imaging of the abdomen and pelvis was performed using the standard protocol following bolus administration of intravenous contrast. RADIATION DOSE REDUCTION: This exam was performed according to the departmental dose-optimization program which includes automated exposure control, adjustment of the mA and/or kV according  to patient size and/or use of iterative reconstruction technique. CONTRAST:  OMNIPAQUE IOHEXOL 300 MG/ML  SOLN COMPARISON:  Same day ultrasound FINDINGS: Lower chest: Heart size is normal.  Mild bibasilar atelectasis. Hepatobiliary: Mildly decreased attenuation of the hepatic parenchyma. No focal liver lesion is identified. Gallbladder is mildly distended. Gallstones seen on previous ultrasound are not evident by CT. Common bile duct is mildly dilated at 10 mm. No intrahepatic biliary dilatation. Pancreas: Mildly enlarged, edematous appearance of the pancreas with peripancreatic fat stranding and ill-defined fluid. No organized or walled off fluid collections. No area of pancreatic parenchymal non enhancement. No ductal dilatation. Spleen: Normal in size without focal abnormality. Adrenals/Urinary Tract: Unremarkable adrenal glands. Kidneys enhance symmetrically without focal lesion, stone, or hydronephrosis. Ureters are nondilated. Urinary bladder appears unremarkable. Stomach/Bowel: Stomach is within normal limits. Appendix appears normal. No evidence of bowel wall thickening, distention, or inflammatory changes. Vascular/Lymphatic: No significant vascular findings are present. Scattered nonenlarged retroperitoneal and mesenteric lymph nodes, likely reactive. No lymphadenopathy. Reproductive: Uterus and bilateral adnexa are unremarkable. Other: No organized abdominopelvic fluid collection. No pneumoperitoneum. Tiny fat containing umbilical hernia. Musculoskeletal: No acute or significant osseous findings. IMPRESSION: 1. Acute uncomplicated pancreatitis. 2. Mildly distended gallbladder and mildly dilated common bile duct at 10 mm. No intrahepatic biliary dilatation. 3. Mild hepatic steatosis. Electronically Signed   By: Duanne Guess D.O.   On: 07/09/2021 16:15   ? ?Anti-infectives: ?Anti-infectives (From admission, onward)  ? ? None  ? ?  ? ? ? ?Assessment/Plan ?Acute pancreatitis, likely gallstone  pancreatitis ?- Right upper quadrant ultrasound shows cholelithiasis without evidence of cholecystitis, CBD 4 mm ?- CT shows mildly enlarged, edematous appearance of the pancreas with peripancreatic fat stranding and ill-defined fluid, no organized or walled off fluid collections. No area of pancreatic parenchymal non enhancement. No ductal dilatation ?- Lipase resolved ?- Abdominal pain resolved ?- Proceed with laparoscopic cholecystectomy with intra-operative cholangiogram today.  The procedure, its risks, benefits and alternatives were discussed with the patient using the spanish interpreting service.  After a full discussion and all questions answered, the patient granted consent to proceed.  We will proceed to the OR as time allows. ?  ?FEN -NPO, IVF ?VTE -SCDs, Lovenox ?ID -ancef preop ?  ?I reviewed nursing notes, last 24 h vitals and pain scores, last 48 h intake and output, last 24 h labs and trends (CBC, CMP, lipase), and last 24 h imaging results (CT abdomen/pelvis and u/s from yesterday) ? ? LOS: 2 days  ? ? ?Quentin Ore, MD ? ?[facility] ?07/11/2021, 7:46 AM ?Please see Amion for pager number during day hours 7:00am-4:30pm ? ?

## 2021-07-11 NOTE — Anesthesia Procedure Notes (Signed)
Procedure Name: Intubation ?Date/Time: 07/11/2021 9:06 AM ?Performed by: Amadeo Garnet, CRNA ?Pre-anesthesia Checklist: Patient identified, Emergency Drugs available, Suction available and Patient being monitored ?Patient Re-evaluated:Patient Re-evaluated prior to induction ?Oxygen Delivery Method: Circle system utilized ?Preoxygenation: Pre-oxygenation with 100% oxygen ?Induction Type: IV induction ?Ventilation: Mask ventilation without difficulty ?Laryngoscope Size: Mac and 4 ?Grade View: Grade I ?Tube type: Oral ?Tube size: 7.0 mm ?Number of attempts: 1 ?Airway Equipment and Method: Stylet and Oral airway ?Placement Confirmation: ETT inserted through vocal cords under direct vision, positive ETCO2 and breath sounds checked- equal and bilateral ?Secured at: 21 cm ?Tube secured with: Tape ?Dental Injury: Teeth and Oropharynx as per pre-operative assessment  ? ? ? ? ?

## 2021-07-11 NOTE — Discharge Instructions (Signed)
CIRUGIA LAPAROSCOPICA: INSTRUCCIONES DE POST OPERATORIO.  Revise siempre los documentos que le entreguen en el lugar donde se ha hecho la cirugia.  SI USTED NECESITA DOCUMENTOS DE INCAPACIDAD (DISABLE) O DE PERMISO FAMILAR (FAMILY LEAVE) NECESITA TRAERLOS A LA OFICINA PARA QUE SEAN PROCESADOS. NO  SE LOS DE A SU DOCTOR. A su alta del hospital se le dara una receta para controlar el dolor. Tomela como ha sido recetada, si la necesita. Si no la necesita puede tomar, Acetaminofen (Tylenol) o Ibuprofen (Advil) para aliviar dolor moderado. Continue tomando el resto de sus medicinas. Si necesita rellenar la receta, llame a la farmacia. ellos contactan a nuestra oficina pidiendo autorizacion. Este tipo de receta no pueden ser rellenadas despues de las  5pm o durante los fines de semana. Con relacion a la dieta: debe ser ligera los primeros dias despues que llege a la casa. Ejemplo: sopas y galleticas. Tome bastante liquido esos dias. La mayoria de los pacientes padecen de inflamacion y cambio de coloracion de la piel alrededor de las incisiones. esto toma dias en resolver.  pnerse una bolsa de hielo en el area affectada ayuda..  Es comun tambien tener un poco de estrenimiento si esta tomado medicinas para el dolor. incremente la cantidad de liquidos a tomar y puede tomar (Colace) esto previene el problema. Si ya tiene estrenimiento, es decir no ha defecado en 48 horas, puede tomar un laxativo (Milk of Magnesia or Miralax) uselo como el paquete le explica.  A menos que se le diga algo diferente. Remueva el bendaje a las 24-48 horas despues dela cirugia. y puede banarse en la ducha sin ningun problema. usted puede tener steri-strips (pequenas curitas transparentes en la piel puesta encima de la incision)  Estas banditas strips should be left on the skin for 7-10 days.   Si su cirujano puso pegamento encima de la incision usted puede banarse bajo la ducha en 24 horas. Este pegamento empezara a caerse en las  proximas 2-3 semanas. Si le pusieron suturas o presillas (grapos) estos seran quitados en su proxima cita en la oficina. . ACTIVIDADES:  Puede hacer actividad ligera.  Como caminar , subir escaleras y poco a poco irlas incrementando tanto como las tolere. Puede tener relaciones sexuales cuando sea comfortable. No carge objetos pesados o haga esfuerzos que no sean aprovados por su doctor. Puede manejar en cuanto no esta tomando medicamentos fuertes (narcoticos) para el dolor, pueda abrochar confortablemente el cinturon de seguridad, y pueda maniobrar y usar los pedales de su vehiculo con seguridad. PUEDE REGRESAR A TRABAJAR  Debe ver a su doctor para una cita de seguimiento en 2-3 semanas despues de la cirugia.  OTRAS ISNSTRUCCIONES:___________________________________________________________________________________ CUANDO LLAMAR A SU MEDICO: FIEBRE mayor de  101.0 No produccion de orina. Sangramiento continue de la herida Incremento de dolor, enrojecimientio o drenaje de la herida (incision) Incremento de dolor abdominal.  The clinic staff is available to answer your questions during regular business hours.  Please don't hesitate to call and ask to speak to one of the nurses for clinical concerns.  If you have a medical emergency, go to the nearest emergency room or call 911.  A surgeon from [facility] is always on call at the hospital. [address], [address], [place], [place]  [address] ? [address] [address], [place], [place]   [address] [phone] ? [phone] ? FAX [phone] Web site: [email or web address]  

## 2021-07-12 ENCOUNTER — Encounter (HOSPITAL_COMMUNITY): Payer: Self-pay | Admitting: Surgery

## 2021-07-12 LAB — HEPATIC FUNCTION PANEL
ALT: 129 U/L — ABNORMAL HIGH (ref 0–44)
ALT: 139 U/L — ABNORMAL HIGH (ref 0–44)
AST: 62 U/L — ABNORMAL HIGH (ref 15–41)
AST: 64 U/L — ABNORMAL HIGH (ref 15–41)
Albumin: 2.8 g/dL — ABNORMAL LOW (ref 3.5–5.0)
Albumin: 3.1 g/dL — ABNORMAL LOW (ref 3.5–5.0)
Alkaline Phosphatase: 173 U/L — ABNORMAL HIGH (ref 38–126)
Alkaline Phosphatase: 180 U/L — ABNORMAL HIGH (ref 38–126)
Bilirubin, Direct: 0.3 mg/dL — ABNORMAL HIGH (ref 0.0–0.2)
Bilirubin, Direct: 0.3 mg/dL — ABNORMAL HIGH (ref 0.0–0.2)
Indirect Bilirubin: 1.1 mg/dL — ABNORMAL HIGH (ref 0.3–0.9)
Indirect Bilirubin: 1.2 mg/dL — ABNORMAL HIGH (ref 0.3–0.9)
Total Bilirubin: 1.4 mg/dL — ABNORMAL HIGH (ref 0.3–1.2)
Total Bilirubin: 1.5 mg/dL — ABNORMAL HIGH (ref 0.3–1.2)
Total Protein: 6.2 g/dL — ABNORMAL LOW (ref 6.5–8.1)
Total Protein: 7.3 g/dL (ref 6.5–8.1)

## 2021-07-12 LAB — LIPASE, BLOOD: Lipase: 32 U/L (ref 11–51)

## 2021-07-12 LAB — BASIC METABOLIC PANEL
Anion gap: 7 (ref 5–15)
BUN: 6 mg/dL (ref 6–20)
CO2: 21 mmol/L — ABNORMAL LOW (ref 22–32)
Calcium: 8.6 mg/dL — ABNORMAL LOW (ref 8.9–10.3)
Chloride: 109 mmol/L (ref 98–111)
Creatinine, Ser: 0.74 mg/dL (ref 0.44–1.00)
GFR, Estimated: >60 mL/min (ref >60)
Glucose, Bld: 129 mg/dL — ABNORMAL HIGH (ref 70–99)
Potassium: 3.8 mmol/L (ref 3.5–5.1)
Sodium: 137 mmol/L (ref 135–145)

## 2021-07-12 NOTE — Plan of Care (Signed)
?  Problem: Clinical Measurements: ?Goal: Diagnostic test results will improve ?Outcome: Not Progressing ?  ?Problem: Clinical Measurements: ?Goal: Will remain free from infection ?Outcome: Not Progressing ?  ?Problem: Nutrition: ?Goal: Adequate nutrition will be maintained ?Outcome: Not Progressing ?  ?Problem: Pain Managment: ?Goal: General experience of comfort will improve ?Outcome: Not Progressing ?  ?

## 2021-07-12 NOTE — TOC Progression Note (Addendum)
Transition of Care (TOC) - Progression Note  ? ? ?Patient Details  ?Name: [facility] ?MRN: [id number] ?Date of Birth: [birth year] ? ?Transition of Care (TOC) CM/SW Contact  ?Kingsley Plan, RN ?Phone Number: ?07/12/2021, 10:57 AM ? ?Clinical Narrative:    ? ?Consult for medication assistance. Changed pharmacy to [facility]. At discharge scripts will go to [facility]. [facility] will call patient with cost of medication, If patient cannot afford TOC Team will see if patient is eligible for [facility] program, however patient can only use MATCH once a year.  ? ?Will schedule appointment at a [facility] then patient can use pharmacy at [facility] post discharge. ? ?First available hospital follow up appointment ( checked all [facility] ) is Jul 27, 2021 at 2 pm at [facility]. Information on AVS   ? ?  ?  ? ?Expected Discharge Plan and Services ?  ?  ?  ?  ?  ?                ?  ?  ?  ?  ?  ?  ?  ?  ?  ?  ? ? ?Social Determinants of Health (SDOH) Interventions ?  ? ?Readmission Risk Interventions ?   ? View : No data to display.  ?  ?  ?  ? ? ?

## 2021-07-12 NOTE — Progress Notes (Signed)
[facility] ?Progress Note ? ?1 Day Post-Op  ?Subjective: ?CC-  ?Feeling much better today. Pain is less than prior to surgery. Mild epigastric discomfort. Denies n/v. Tolerating diet. ?AST, ALT, Alk phos slightly up, Tbili down 1.4<<2 ? ?Objective: ?Vital signs in last 24 hours: ?Temp:  [97.4 ?F (36.3 ?C)-99 ?F (37.2 ?C)] 98.1 ?F (36.7 ?C) (05/01 0825) ?Pulse Rate:  [81-120] 82 (05/01 0825) ?Resp:  [12-18] 17 (05/01 0825) ?BP: (94-121)/(61-89) 94/74 (05/01 0825) ?SpO2:  [91 %-98 %] 97 % (05/01 0825) ?Last BM Date : 07/08/21 ? ?Intake/Output from previous day: ?04/30 0701 - 05/01 0700 ?In: 1050 [I.V.:1000; IV Piggyback:50] ?Out: 10 [Blood:10] ?Intake/Output this shift: ?No intake/output data recorded. ? ?PE: ?Gen:  Alert, NAD, pleasant ?Abd: soft, ND, mild epigastric TTP, lap incisions cdi ? ?Lab Results:  ?Recent Labs  ?  07/10/21 ?0111 07/11/21 ?0040  ?WBC 9.9 11.5*  ?HGB 13.8 13.1  ?HCT 41.8 40.2  ?PLT 241 261  ? ?BMET ?Recent Labs  ?  07/11/21 ?0040 07/12/21 ?0154  ?NA 135 137  ?K 3.5 3.8  ?CL 106 109  ?CO2 22 21*  ?GLUCOSE 121* 129*  ?BUN <5* 6  ?CREATININE 0.76 0.74  ?CALCIUM 8.2* 8.6*  ? ?PT/INR ?No results for input(s): LABPROT, INR in the last 72 hours. ?CMP  ?   ?Component Value Date/Time  ? NA 137 07/12/2021 0154  ? K 3.8 07/12/2021 0154  ? CL 109 07/12/2021 0154  ? CO2 21 (L) 07/12/2021 0154  ? GLUCOSE 129 (H) 07/12/2021 0154  ? BUN 6 07/12/2021 0154  ? CREATININE 0.74 07/12/2021 0154  ? CALCIUM 8.6 (L) 07/12/2021 0154  ? PROT 6.2 (L) 07/12/2021 0154  ? ALBUMIN 2.8 (L) 07/12/2021 0154  ? AST 62 (H) 07/12/2021 0154  ? ALT 129 (H) 07/12/2021 0154  ? ALKPHOS 173 (H) 07/12/2021 0154  ? BILITOT 1.4 (H) 07/12/2021 0154  ? GFRNONAA >60 07/12/2021 0154  ? ?Lipase  ?   ?Component Value Date/Time  ? LIPASE 32 07/12/2021 0154  ? ? ? ? ? ?Studies/Results: ?DG Cholangiogram Operative ? ?Result Date: 07/12/2021 ?CLINICAL DATA:  Cholelithiasis with pancreatitis. EXAM: INTRAOPERATIVE CHOLANGIOGRAM COMPARISON:   CT AP and US Abdomen, 07/09/2021. FLUOROSCOPY: Exposure Index (as provided by the fluoroscopic device): 2.4 mGy Kerma FINDINGS: Multiple, limited oblique planar images of the RIGHT upper quadrant obtained C-arm. Images demonstrating laparoscopic instrumentation, cystic duct cannulation and antegrade cholangiogram. Spillage of contrast into the duodenum. Distal common bile duct filling defect. See key image. Minimal extrahepatic biliary ductal dilation. IMPRESSION: Fluoroscopic imaging for intraoperative cholangiogram. Distal common bile duct filling defect with minimal extrahepatic biliary ductal dilation. For complete description of intra procedural findings, please see performing service dictation. Electronically Signed   By: Michaelle Birks M.D.   On: 07/12/2021 00:39  ? ?DG C-Arm 1-60 Min-No Report ? ?Result Date: 07/11/2021 ?Fluoroscopy was utilized by the requesting physician.  No radiographic interpretation.   ? ?Anti-infectives: ?Anti-infectives (From admission, onward)  ? ? Start     Dose/Rate Route Frequency Ordered Stop  ? 07/11/21 0845  ceFAZolin (ANCEF) IVPB 1 g/50 mL premix       ? 1 g ?100 mL/hr over 30 Minutes Intravenous  Once 07/11/21 0757 07/11/21 0910  ? ?  ? ? ? ?Assessment/Plan ?Gallstone pancreatitis  ?POD#1 s/p laparoscopic cholecystectomy with [facility] 4/30 Dr. Thermon Leyland ?- Holley with distal common bile duct filling defect, not fully obstructing ?- Patient clinically improved. Tbili is trending down. Unsure if filling defect is from a  retained stone or possibly inflammation from pancreatitis. Will recheck hepatic function panel later today. If trending down may be able to discharge home with repeat labs as outpatient. If LFTs up may need GI consult. ?  ?FEN - regular diet ?VTE -SCDs, Lovenox ?ID -ancef periop ? ? ? LOS: 3 days  ? ? ?Wellington Hampshire, PA-C ?[facility] ?07/12/2021, 9:12 AM ?Please see Amion for pager number during day hours 7:00am-4:30pm ? ?

## 2021-07-13 ENCOUNTER — Other Ambulatory Visit (HOSPITAL_COMMUNITY): Payer: Self-pay

## 2021-07-13 LAB — HEPATIC FUNCTION PANEL
ALT: 105 U/L — ABNORMAL HIGH (ref 0–44)
AST: 40 U/L (ref 15–41)
Albumin: 2.7 g/dL — ABNORMAL LOW (ref 3.5–5.0)
Alkaline Phosphatase: 155 U/L — ABNORMAL HIGH (ref 38–126)
Bilirubin, Direct: 0.1 mg/dL (ref 0.0–0.2)
Indirect Bilirubin: 0.9 mg/dL (ref 0.3–0.9)
Total Bilirubin: 1 mg/dL (ref 0.3–1.2)
Total Protein: 6 g/dL — ABNORMAL LOW (ref 6.5–8.1)

## 2021-07-13 LAB — BASIC METABOLIC PANEL
Anion gap: 7 (ref 5–15)
BUN: 8 mg/dL (ref 6–20)
CO2: 23 mmol/L (ref 22–32)
Calcium: 8.2 mg/dL — ABNORMAL LOW (ref 8.9–10.3)
Chloride: 106 mmol/L (ref 98–111)
Creatinine, Ser: 0.85 mg/dL (ref 0.44–1.00)
GFR, Estimated: >60 mL/min (ref >60)
Glucose, Bld: 114 mg/dL — ABNORMAL HIGH (ref 70–99)
Potassium: 3.4 mmol/L — ABNORMAL LOW (ref 3.5–5.1)
Sodium: 136 mmol/L (ref 135–145)

## 2021-07-13 LAB — SURGICAL PATHOLOGY

## 2021-07-13 MED ORDER — TRAMADOL HCL 50 MG PO TABS
50.0000 mg | ORAL_TABLET | Freq: Four times a day (QID) | ORAL | 0 refills | Status: AC | PRN
  Filled 2021-07-13: qty 15, 4d supply, fill #0

## 2021-07-13 NOTE — Discharge Planning (Signed)
Patient discharged home in stable condition. Verbalizes understanding of all discharge instructions, including home medications and follow up appointments. 

## 2021-07-13 NOTE — Plan of Care (Signed)
°  Problem: Clinical Measurements: °Goal: Diagnostic test results will improve °Outcome: Not Progressing °  °Problem: Clinical Measurements: °Goal: Will remain free from infection °Outcome: Not Progressing °  °Problem: Elimination: °Goal: Will not experience complications related to bowel motility °Outcome: Not Progressing °  °Problem: Pain Managment: °Goal: General experience of comfort will improve °Outcome: Not Progressing °  °

## 2021-07-13 NOTE — Discharge Summary (Signed)
[facility] ?Discharge Summary  ? ?Patient ID: ?[facility] ?MRN: [id number] ?DOB/AGE: 06-09-1985 36 y.o. ? ?Admit date: 07/09/2021 ?Discharge date: 07/13/2021 ? ?Admitting Diagnosis: ?Gallstone pancreatitis  ? ?Discharge Diagnosis ?Patient Active Problem List  ? Diagnosis Date Noted  ? Acute gallstone pancreatitis 07/09/2021  ? NSVD (normal spontaneous vaginal delivery) 07/14/2015  ? Active labor 07/12/2015  ? ? ?Consultants ?None ? ?Imaging: ?DG Cholangiogram Operative ? ?Result Date: 07/12/2021 ?CLINICAL DATA:  Cholelithiasis with pancreatitis. EXAM: INTRAOPERATIVE CHOLANGIOGRAM COMPARISON:  CT AP and US Abdomen, 07/09/2021. FLUOROSCOPY: Exposure Index (as provided by the fluoroscopic device): 2.4 mGy Kerma FINDINGS: Multiple, limited oblique planar images of the RIGHT upper quadrant obtained C-arm. Images demonstrating laparoscopic instrumentation, cystic duct cannulation and antegrade cholangiogram. Spillage of contrast into the duodenum. Distal common bile duct filling defect. See key image. Minimal extrahepatic biliary ductal dilation. IMPRESSION: Fluoroscopic imaging for intraoperative cholangiogram. Distal common bile duct filling defect with minimal extrahepatic biliary ductal dilation. For complete description of intra procedural findings, please see performing service dictation. Electronically Signed   By: Michaelle Birks M.D.   On: 07/12/2021 00:39  ? ?DG C-Arm 1-60 Min-No Report ? ?Result Date: 07/11/2021 ?Fluoroscopy was utilized by the requesting physician.  No radiographic interpretation.   ? ?Procedures ?Dr. Thermon Leyland (07/11/2021) - Laparoscopic Cholecystectomy with IOC ? ?Hospital Course:  ?Leandra Barnwell is a 36yo female who presented to [facility] 4/28 with 1-2 days of abdominal pain.  Workup showed gallstones pancreatitis.  Patient was admitted, pancreatitis managed with supportive care. Once pancreatitis resolved she was taken to the operating room for laparoscopic  cholecystectomy. IOC revealed distal common bile duct filling defect. LFTs were trended postoperatively and total bilirubin normalized. Suspect the filling defect was secondary to pancreatic edema and not a retained stone. On POD2, the patient was voiding well, tolerating diet, ambulating well, pain well controlled, vital signs stable, incisions c/d/i and felt stable for discharge home.  Patient will follow up as below and knows to call with questions or concerns.   ? ?I have personally reviewed the patients medication history on the [place] controlled substance database.  ? ?Physical Exam: ?General:  Alert, NAD, pleasant, comfortable ?Pulm: rate and effort normal ?Abd:  Soft, ND, appropriately tender, multiple lap incisions C/D/I ? ?Allergies as of 07/13/2021   ?No Known Allergies ?  ? ?  ?Medication List  ?  ? ?TAKE these medications   ? ?ibuprofen 600 MG tablet ?Commonly known as: ADVIL ?Take 1 tablet (600 mg total) by mouth every 6 (six) hours. ?  ?traMADol 50 MG tablet ?Commonly known as: ULTRAM ?Take 1 tablet (50 mg total) by mouth every 6 (six) hours as needed for severe pain. ?  ?ZANTAC PO ?Take 1 tablet by mouth daily. ?  ? ?  ? ? ? ? Follow-up Information   ? ? [facility], [place]. Call on 08/03/2021.   ?Specialty: General Surgery ?Why: Your appointment is  08/03/21 at 8:30 am ?Please arrive 30 minutes prior to your appointment to check in and fill out paperwork. bring photo ID and insurance information. ?Contact information: ?[address] ?[address] ?[place] ?[phone] ? ?  ?  ? ? Camillia Herter, NP Follow up.   ?Specialty: Nurse Practitioner ?Why: Jul 27, 2021 at 2 pm ?Contact information: ?[address] ?[address] ?[place] [address] ?[phone] ? ? ?  ?  ? ?  ?  ? ?  ? ? ? ?Signed: ?Wellington Hampshire, PA-C ?  [facility] ?07/13/2021, 9:46 AM ?Please see Amion for pager number during day hours 7:00am-4:30pm ? ?

## 2021-07-21 NOTE — Progress Notes (Signed)
?Subjective:  ? ? [facility] - 36 y.o. female MRN [id number]  Date of birth: 1985-05-31 ? ?HPI ? ?Kendra Fitzgerald Kendra Fitzgerald is a 36 year-old female to establish care and hospital discharge follow-up.  ? ?Current issues and/or concerns: ?Hospital discharge follow-up: ?07/09/2021 - 07/13/2021 [facility] per PA note: ?Admit date: 07/09/2021 ?Discharge date: 07/13/2021 ?  ?Admitting Diagnosis: ?Gallstone pancreatitis  ?  ?Discharge Diagnosis ?Acute gallstone pancreatitis ? ?Hospital Course:  ?Kendra Fitzgerald is a 36yo female who presented to [facility] 4/28 with 1-2 days of abdominal pain.  Workup showed gallstones pancreatitis.  Patient was admitted, pancreatitis managed with supportive care. Once pancreatitis resolved she was taken to the operating room for laparoscopic cholecystectomy. IOC revealed distal common bile duct filling defect. LFTs were trended postoperatively and total bilirubin normalized. Suspect the filling defect was secondary to pancreatic edema and not a retained stone. On POD2, the patient was voiding well, tolerating diet, ambulating well, pain well controlled, vital signs stable, incisions c/d/i and felt stable for discharge home.  Patient will follow up as below and knows to call with questions or concerns.   ? ?Follow-Ups ?Call [facility] (General Surgery) on 08/03/2021; Your appointment is 5/23 at 11:00am  ?Please arrive 30 minutes prior to your appointment to check in and fill out paperwork. bring photo ID and insurance information. ? ?Today's visit 07/27/2021: ?Doing well since hospital discharge. No current issues or concerns. Denies stomach pain, nausea, vomiting and additional red flag symptoms. Tolerating food and beverages as normal. No concerns with abdominal incisions. She is aware of appointment with [facility] on 08/03/2021.  ? ? ?ROS per HPI  ? ? ? ?Health Maintenance:  ?Health Maintenance Due  ?Topic Date Due  ? COVID-19 Vaccine (1) Never done  ? Hepatitis C  Screening  Never done  ? PAP SMEAR-Modifier  Never done  ? ? ?Past Medical History: ?Patient Active Problem List  ? Diagnosis Date Noted  ? Acute gallstone pancreatitis 07/09/2021  ? NSVD (normal spontaneous vaginal delivery) 07/14/2015  ? Active labor 07/12/2015  ? ? ?Social History  ? reports that she has never smoked. She has never been exposed to tobacco smoke. She has never used smokeless tobacco. She reports that she does not drink alcohol and does not use drugs.  ? ?Family History  ?family history is not on file.  ? ?Medications: reviewed and updated ?  ?Objective:  ? Physical Exam ?BP 101/69 (BP Location: Left Arm, Patient Position: Sitting, Cuff Size: Large)   Pulse 75   Temp 98.3 ?F (36.8 ?C)   Resp 18   Ht 4' 11.92" (1.522 m)   Wt 152 lb 3.2 oz (69 kg)   SpO2 96%   Breastfeeding No   BMI 29.80 kg/m?  ? ?Physical Exam ?HENT:  ?   Head: Normocephalic and atraumatic.  ?Eyes:  ?   Extraocular Movements: Extraocular movements intact.  ?   Conjunctiva/sclera: Conjunctivae normal.  ?   Pupils: Pupils are equal, round, and reactive to light.  ?Cardiovascular:  ?   Rate and Rhythm: Normal rate and regular rhythm.  ?   Pulses: Normal pulses.  ?   Heart sounds: Normal heart sounds.  ?Pulmonary:  ?   Effort: Pulmonary effort is normal.  ?   Breath sounds: Normal breath sounds.  ?Abdominal:  ?   General: Bowel sounds are normal.  ?   Palpations: Abdomen is soft.  ?   Comments: Abdominal surgical incisions clean, dry, and intact.  No evidence of erythema or drainage.   ?Musculoskeletal:  ?   Cervical back: Normal range of motion and neck supple.  ?Neurological:  ?   General: No focal deficit present.  ?   Mental Status: She is alert and oriented to person, place, and time.  ?Psychiatric:     ?   Mood and Affect: Mood normal.     ?   Behavior: Behavior normal.  ? ? ?   ?Assessment & Plan:  ?1. Encounter to establish care: ?- Patient presents today to establish care.  ?- Return for annual physical examination,  labs, and health maintenance. Arrive fasting meaning having no food for at least 8 hours prior to appointment. You may have only water or black coffee. Please take scheduled medications as normal. ? ?2. Hospital discharge follow-up: ?- Reviewed hospital course, current medications, ensured proper follow-up in place, and addressed concerns.  ? ?3. Gallstone pancreatitis: ?- Keep appointment scheduled on 08/03/2021 at  [facility] (General Surgery). ? ?4. Language barrier: ?- [place] in-person interpreter, Scarlette Calico, participated during today's appointment.  ? ? ? ?Patient was given clear instructions to go to Emergency Department or return to medical center if symptoms don't improve, worsen, or new problems develop.The patient verbalized understanding. ? ?I discussed the assessment and treatment plan with the patient. The patient was provided an opportunity to ask questions and all were answered. The patient agreed with the plan and demonstrated an understanding of the instructions. ?  ?The patient was advised to call back or seek an in-person evaluation if the symptoms worsen or if the condition fails to improve as anticipated. ? ? ? ?Ricky Stabs, NP ?07/27/2021, 10:47 PM ?Primary Care at [facility]  ? ?

## 2021-07-27 ENCOUNTER — Encounter: Payer: Self-pay | Admitting: Family

## 2021-07-27 ENCOUNTER — Ambulatory Visit (INDEPENDENT_AMBULATORY_CARE_PROVIDER_SITE_OTHER): Payer: Self-pay | Admitting: Family

## 2021-07-27 VITALS — BP 101/69 | HR 75 | Temp 98.3°F | Resp 18 | Ht 59.92 in | Wt 152.2 lb

## 2021-07-27 DIAGNOSIS — Z789 Other specified health status: Secondary | ICD-10-CM

## 2021-07-27 DIAGNOSIS — Z09 Encounter for follow-up examination after completed treatment for conditions other than malignant neoplasm: Secondary | ICD-10-CM

## 2021-07-27 DIAGNOSIS — K851 Biliary acute pancreatitis without necrosis or infection: Secondary | ICD-10-CM

## 2021-07-27 DIAGNOSIS — Z7689 Persons encountering health services in other specified circumstances: Secondary | ICD-10-CM

## 2021-07-27 NOTE — Progress Notes (Signed)
Pt presents to establish care 

## 2021-08-10 NOTE — Progress Notes (Deleted)
Patient ID: Kendra Fitzgerald, female    DOB: [birth year]  MRN: [id number]  CC: Annual Physical Exam  Subjective: Kendra Fitzgerald is a 36 y.o. female who presents for annual physical exam.   Her concerns today include: ***  Patient Active Problem List   Diagnosis Date Noted   Acute gallstone pancreatitis 07/09/2021   NSVD (normal spontaneous vaginal delivery) 07/14/2015   Active labor 07/12/2015     Current Outpatient Medications on File Prior to Visit  Medication Sig Dispense Refill   ibuprofen (ADVIL,MOTRIN) 600 MG tablet Take 1 tablet (600 mg total) by mouth every 6 (six) hours. (Patient not taking: Reported on 07/09/2021) 30 tablet 0   raNITIdine HCl (ZANTAC PO) Take 1 tablet by mouth daily.     traMADol (ULTRAM) 50 MG tablet Take 1 tablet (50 mg total) by mouth every 6 (six) hours as needed for severe pain. 15 tablet 0   No current facility-administered medications on file prior to visit.    No Known Allergies  Social History   Socioeconomic History   Marital status: Single    Spouse name: Not on file   Number of children: Not on file   Years of education: Not on file   Highest education level: Not on file  Occupational History   Not on file  Tobacco Use   Smoking status: Never    Passive exposure: Never   Smokeless tobacco: Never  Substance and Sexual Activity   Alcohol use: No   Drug use: No   Sexual activity: Yes    Birth control/protection: Injection  Other Topics Concern   Not on file  Social History Narrative   Not on file   Social Determinants of Health   Financial Resource Strain: Not on file  Food Insecurity: Not on file  Transportation Needs: Not on file  Physical Activity: Not on file  Stress: Not on file  Social Connections: Not on file  Intimate Partner Violence: Not on file    No family history on file.  Past Surgical History:  Procedure Laterality Date   CHOLECYSTECTOMY N/A 07/11/2021   Procedure: LAPAROSCOPIC  CHOLECYSTECTOMY WITH INTRAOPERATIVE CHOLANGIOGRAM;  Surgeon: Stechschulte, Hyman Hopes, MD;  Location: [facility] OR;  Service: General;  Laterality: N/A;    ROS: Review of Systems Negative except as stated above  PHYSICAL EXAM: There were no vitals taken for this visit.  Physical Exam  {female adult master:310786} {female adult master:310785}     Latest Ref Rng & Units 07/13/2021   12:54 AM 07/12/2021   12:58 PM 07/12/2021    1:54 AM  CMP  Glucose 70 - 99 mg/dL 417    408    BUN 6 - 20 mg/dL 8    6    Creatinine 1.44 - 1.00 mg/dL 8.18    5.63    Sodium 135 - 145 mmol/L 136    137    Potassium 3.5 - 5.1 mmol/L 3.4    3.8    Chloride 98 - 111 mmol/L 106    109    CO2 22 - 32 mmol/L 23    21    Calcium 8.9 - 10.3 mg/dL 8.2    8.6    Total Protein 6.5 - 8.1 g/dL 6.0   7.3   6.2    Total Bilirubin 0.3 - 1.2 mg/dL 1.0   1.5   1.4    Alkaline Phos 38 - 126 U/L 155   180   173  AST 15 - 41 U/L 40   64   62    ALT 0 - 44 U/L 105   139   129     Lipid Panel     Component Value Date/Time   TRIG 47 07/10/2021 0111    CBC    Component Value Date/Time   WBC 11.5 (H) 07/11/2021 0040   RBC 4.70 07/11/2021 0040   HGB 13.1 07/11/2021 0040   HCT 40.2 07/11/2021 0040   PLT 261 07/11/2021 0040   MCV 85.5 07/11/2021 0040   MCH 27.9 07/11/2021 0040   MCHC 32.6 07/11/2021 0040   RDW 14.6 07/11/2021 0040    ASSESSMENT AND PLAN:  There are no diagnoses linked to this encounter.   Patient was given the opportunity to ask questions.  Patient verbalized understanding of the plan and was able to repeat key elements of the plan. Patient was given clear instructions to go to Emergency Department or return to medical center if symptoms don't improve, worsen, or new problems develop.The patient verbalized understanding.   No orders of the defined types were placed in this encounter.    Requested Prescriptions    No prescriptions requested or ordered in this encounter    No follow-ups on  file.  Rema Fendt, NP

## 2021-08-16 ENCOUNTER — Encounter: Payer: Self-pay | Admitting: Family

## 2021-08-16 DIAGNOSIS — Z789 Other specified health status: Secondary | ICD-10-CM

## 2021-08-16 DIAGNOSIS — Z1329 Encounter for screening for other suspected endocrine disorder: Secondary | ICD-10-CM

## 2021-08-16 DIAGNOSIS — Z1159 Encounter for screening for other viral diseases: Secondary | ICD-10-CM

## 2021-08-16 DIAGNOSIS — Z113 Encounter for screening for infections with a predominantly sexual mode of transmission: Secondary | ICD-10-CM

## 2021-08-16 DIAGNOSIS — Z13228 Encounter for screening for other metabolic disorders: Secondary | ICD-10-CM

## 2021-08-16 DIAGNOSIS — Z131 Encounter for screening for diabetes mellitus: Secondary | ICD-10-CM

## 2021-08-16 DIAGNOSIS — Z13220 Encounter for screening for lipoid disorders: Secondary | ICD-10-CM

## 2021-08-16 DIAGNOSIS — Z0000 Encounter for general adult medical examination without abnormal findings: Secondary | ICD-10-CM

## 2021-08-16 DIAGNOSIS — Z1321 Encounter for screening for nutritional disorder: Secondary | ICD-10-CM

## 2021-08-16 DIAGNOSIS — Z124 Encounter for screening for malignant neoplasm of cervix: Secondary | ICD-10-CM

## 2021-08-16 NOTE — Progress Notes (Signed)
Patient ID: Kendra Fitzgerald, female    DOB: 19-Dec-1985  MRN: [id number]  CC: Annual Physical Exam  Subjective: Kendra Fitzgerald Loralyn Freshwater is a 36 y.o. female who presents for annual physical exam.   Her concerns today include:  Patient reports she recently had PAP smear at the [facility].    Patient Active Problem List   Diagnosis Date Noted   Acute gallstone pancreatitis 07/09/2021   NSVD (normal spontaneous vaginal delivery) 07/14/2015   Active labor 07/12/2015     Current Outpatient Medications on File Prior to Visit  Medication Sig Dispense Refill   medroxyPROGESTERone (DEPO-PROVERA) 150 MG/ML injection Inject 150 mg into the muscle every 3 (three) months.     famotidine (PEPCID) 20 MG tablet Take by mouth.     ibuprofen (ADVIL) 200 MG tablet Take by mouth.     raNITIdine HCl (ZANTAC PO) Take 1 tablet by mouth daily.     traMADol (ULTRAM) 50 MG tablet Take 1 tablet (50 mg total) by mouth every 6 (six) hours as needed for severe pain. 15 tablet 0   No current facility-administered medications on file prior to visit.    No Known Allergies  Social History   Socioeconomic History   Marital status: Single    Spouse name: Not on file   Number of children: Not on file   Years of education: Not on file   Highest education level: Not on file  Occupational History   Not on file  Tobacco Use   Smoking status: Never    Passive exposure: Never   Smokeless tobacco: Never  Substance and Sexual Activity   Alcohol use: No   Drug use: No   Sexual activity: Yes    Birth control/protection: Injection  Other Topics Concern   Not on file  Social History Narrative   Not on file   Social Determinants of Health   Financial Resource Strain: Not on file  Food Insecurity: Not on file  Transportation Needs: Not on file  Physical Activity: Not on file  Stress: Not on file  Social Connections: Not on file  Intimate Partner Violence: Not on file     No family history on file.  Past Surgical History:  Procedure Laterality Date   CHOLECYSTECTOMY N/A 07/11/2021   Procedure: LAPAROSCOPIC CHOLECYSTECTOMY WITH INTRAOPERATIVE CHOLANGIOGRAM;  Surgeon: Quentin Ore, MD;  Location: [facility] OR;  Service: General;  Laterality: N/A;    ROS: Review of Systems Negative except as stated above  PHYSICAL EXAM: BP 103/71 (BP Location: Left Arm, Patient Position: Sitting, Cuff Size: Normal)   Pulse 71   Temp 98.3 F (36.8 C)   Resp 18   Ht 4' 11.92" (1.522 m)   Wt 155 lb (70.3 kg)   SpO2 98%   BMI 30.35 kg/m   Physical Exam HENT:     Head: Normocephalic and atraumatic.     Right Ear: Tympanic membrane, ear canal and external ear normal.     Left Ear: Tympanic membrane, ear canal and external ear normal.     Nose: Nose normal.     Mouth/Throat:     Mouth: Mucous membranes are moist.     Pharynx: Oropharynx is clear.  Eyes:     Extraocular Movements: Extraocular movements intact.     Conjunctiva/sclera: Conjunctivae normal.     Pupils: Pupils are equal, round, and reactive to light.  Cardiovascular:     Rate and Rhythm: Normal rate and regular rhythm.  Pulses: Normal pulses.     Heart sounds: Normal heart sounds.  Pulmonary:     Effort: Pulmonary effort is normal.     Breath sounds: Normal breath sounds.  Chest:     Comments: Patient declined. Abdominal:     General: Bowel sounds are normal.     Palpations: Abdomen is soft.  Genitourinary:    Comments: Patient declined. Musculoskeletal:        General: Normal range of motion.     Right shoulder: Normal.     Left shoulder: Normal.     Right upper arm: Normal.     Left upper arm: Normal.     Right elbow: Normal.     Left elbow: Normal.     Right forearm: Normal.     Left forearm: Normal.     Right wrist: Normal.     Left wrist: Normal.     Right hand: Normal.     Left hand: Normal.     Cervical back: Normal, normal range of motion and neck supple.      Thoracic back: Normal.     Lumbar back: Normal.     Right hip: Normal.     Left hip: Normal.     Right upper leg: Normal.     Left upper leg: Normal.     Right knee: Normal.     Left knee: Normal.     Right lower leg: Normal.     Left lower leg: Normal.     Right ankle: Normal.     Left ankle: Normal.     Right foot: Normal.     Left foot: Normal.  Skin:    General: Skin is warm and dry.     Capillary Refill: Capillary refill takes less than 2 seconds.  Neurological:     General: No focal deficit present.     Mental Status: She is alert and oriented to person, place, and time.  Psychiatric:        Mood and Affect: Mood normal.        Behavior: Behavior normal.      ASSESSMENT AND PLAN: Patient expresses financial concerns for completing annual physical on today. Reports she had an annual physical exam with PAP smear within the last year at the [facility] Reports the health department sent her here to establish care after she had surgery (cholecystectomy). Reports she prefers to continue being seen at the health department because she usually pays $20 or less for appointments and says she has many medical bills piling up currently. She wants to know if the medical records from the health department can be sent to our office so that she doesn't have to repeat the physical on today. She wants to know from my opinion of whether she should continue with the physical on today or not.  I informed the patient that the medical records from the health department can be sent to our office if she signs a medical release stating that she would like them to do so.  However, I cannot tell her whether she should or should not continue with today's physical. I explained today's physical will only be completed if she wishes to continue. Patient verbalized that she would like to continue with the physical exam on today.  1. Annual physical exam - Counseled on 150 minutes of  exercise per week as tolerated, healthy eating (including decreased daily intake of saturated fats, cholesterol, added sugars, sodium), STI prevention, and routine  healthcare maintenance.  2. Screening for metabolic disorder - Update calcium. - Screening liver function.  - Calcium - Hepatic Function Panel  3. Diabetes mellitus screening - Hemoglobin A1c to screen for pre-diabetes/diabetes. - Hemoglobin A1c  4. Screening cholesterol level - Lipid panel to screen for high cholesterol.  - Lipid Panel  5. Thyroid disorder screen - TSH to check thyroid function.  - TSH  6. Pap smear for cervical cancer screening 7. Routine screening for STI (sexually transmitted infection) - Patient declined reporting she is up to date.   8. Need for hepatitis C screening test - Hepatitis C antibody to screen for hepatitis C.  - Hepatitis C Antibody  9. Encounter for vitamin deficiency screening - Screening for deficiency.  - Vitamin D, 25-hydroxy  10. Language barrier - [employer]. Name: Vernona RiegerLaura  ID#: [id number]    Patient was given the opportunity to ask questions.  Patient verbalized understanding of the plan and was able to repeat key elements of the plan. Patient was given clear instructions to go to Emergency Department or return to medical center if symptoms don't improve, worsen, or new problems develop.The patient verbalized understanding.   Orders Placed This Encounter  Procedures   Hepatitis C Antibody   Calcium   Hepatic Function Panel   Vitamin D, 25-hydroxy   Hemoglobin A1c   TSH   Lipid Panel    Return in about 1 year (around 08/24/2022) for Physical per patient preference.  Rema FendtAmy J Jasman Pfeifle, NP

## 2021-08-23 ENCOUNTER — Ambulatory Visit (INDEPENDENT_AMBULATORY_CARE_PROVIDER_SITE_OTHER): Payer: Self-pay | Admitting: Family

## 2021-08-23 ENCOUNTER — Encounter: Payer: Self-pay | Admitting: Family

## 2021-08-23 VITALS — BP 103/71 | HR 71 | Temp 98.3°F | Resp 18 | Ht 59.92 in | Wt 155.0 lb

## 2021-08-23 DIAGNOSIS — Z758 Other problems related to medical facilities and other health care: Secondary | ICD-10-CM

## 2021-08-23 DIAGNOSIS — Z1159 Encounter for screening for other viral diseases: Secondary | ICD-10-CM

## 2021-08-23 DIAGNOSIS — Z13220 Encounter for screening for lipoid disorders: Secondary | ICD-10-CM

## 2021-08-23 DIAGNOSIS — Z131 Encounter for screening for diabetes mellitus: Secondary | ICD-10-CM

## 2021-08-23 DIAGNOSIS — Z789 Other specified health status: Secondary | ICD-10-CM

## 2021-08-23 DIAGNOSIS — Z124 Encounter for screening for malignant neoplasm of cervix: Secondary | ICD-10-CM

## 2021-08-23 DIAGNOSIS — Z1321 Encounter for screening for nutritional disorder: Secondary | ICD-10-CM

## 2021-08-23 DIAGNOSIS — Z13228 Encounter for screening for other metabolic disorders: Secondary | ICD-10-CM

## 2021-08-23 DIAGNOSIS — Z0000 Encounter for general adult medical examination without abnormal findings: Secondary | ICD-10-CM

## 2021-08-23 DIAGNOSIS — Z5986 Financial insecurity: Secondary | ICD-10-CM

## 2021-08-23 DIAGNOSIS — Z113 Encounter for screening for infections with a predominantly sexual mode of transmission: Secondary | ICD-10-CM

## 2021-08-23 DIAGNOSIS — Z1329 Encounter for screening for other suspected endocrine disorder: Secondary | ICD-10-CM

## 2021-08-23 DIAGNOSIS — Z130 Encounter for screening for diseases of the blood and blood-forming organs and certain disorders involving the immune mechanism: Secondary | ICD-10-CM

## 2021-08-23 NOTE — Progress Notes (Signed)
Pt presents for annual physical exam declined pap smear states that she completed w/[facility]ath dept on 05/17

## 2021-08-23 NOTE — Patient Instructions (Signed)
Cuidados preventivos en las mujeres de 21 a 39 aos de edad Preventive Care 21-37 Years Old, Female Los cuidados preventivos hacen referencia a las opciones en cuanto al estilo de vida y a las visitas al mdico, las cuales pueden promover la salud y el bienestar. Las visitas de cuidado preventivo tambin se denominan exmenes de bienestar. Qu puedo esperar para mi visita de cuidado preventivo? Asesoramiento Durante la visita de cuidado preventivo, el mdico puede preguntarle sobre lo siguiente: Antecedentes mdicos, incluidos los siguientes: Problemas mdicos pasados. Antecedentes mdicos familiares. Antecedentes de embarazo. Salud actual, incluido lo siguiente: Ciclo menstrual. Mtodos anticonceptivos. Su bienestar emocional. Bienestar en el hogar y las relaciones personales. Actividad sexual y salud sexual. Estilo de vida, incluido lo siguiente: Consumo de alcohol, nicotina, tabaco o drogas. Acceso a armas de fuego. Hbitos de alimentacin, ejercicio y sueo. Su trabajo y ambiente laboral. Uso de pantalla solar. Cuestiones de seguridad, como el uso de cinturn de seguridad y casco de bicicleta. Examen fsico El mdico puede controlar lo siguiente: Estatura y peso. Estos pueden usarse para calcular el IMC (ndice de masa corporal). El IMC es una medicin que indica si tiene un peso saludable. Circunferencia de la cintura. Es una medicin alrededor de la cintura. Esta medicin tambin indica si tiene un peso saludable y puede ayudar a predecir su riesgo de padecer ciertas enfermedades, como diabetes tipo 2 y presin arterial alta. Frecuencia cardaca y presin arterial. Temperatura corporal. Piel para detectar manchas anormales. Qu vacunas necesito?  Las vacunas se aplican a varias edades, segn un cronograma. El mdico le recomendar vacunas segn su edad, sus antecedentes mdicos, su estilo de vida y otros factores, como los viajes o el lugar donde trabaja. Qu pruebas  necesito? Pruebas de deteccin El mdico puede recomendar pruebas de deteccin de ciertas afecciones. Esto puede incluir: Examen plvico y prueba de Papanicolaou. Niveles de lpidos y colesterol. Pruebas de deteccin de la diabetes. Esto se realiza mediante un control del azcar en la sangre (glucosa) despus de no haber comido durante un periodo de tiempo (ayuno). Prueba de hepatitis B. Prueba de hepatitis C. Prueba del VIH (virus de inmunodeficiencia humana). Pruebas de infecciones de transmisin sexual (ITS), si est en riesgo. Pruebas de deteccin de cncer relacionado con las mutaciones del BRCA. Es posible que se las deba realizar si tiene antecedentes de cncer de mama, de ovario, de trompas o peritoneal. Hable con su mdico sobre los resultados de las pruebas, las opciones de tratamiento y, si corresponde, la necesidad de realizar ms pruebas. Siga estas instrucciones en su casa: Comida y bebida  Siga una dieta saludable que incluya frutas y verduras frescas, cereales integrales, protenas magras y productos lcteos descremados. Tome los suplementos vitamnicos y minerales como se lo haya indicado el mdico. No beba alcohol si: Su mdico le indica no hacerlo. Est embarazada, puede estar embarazada o est tratando de quedar embarazada. Si bebe alcohol: Limite la cantidad que consume de 0 a 1 medida por da. Sepa cunta cantidad de alcohol hay en las bebidas que toma. En los [place], una medida equivale a una botella de cerveza de 12 oz (355 ml), un vaso de vino de 5 oz (148 ml) o un vaso de una bebida alcohlica de alta graduacin de 1 oz (44 ml). Estilo de vida [place] los dientes a la maana y a la noche con pasta dental con fluoruro. Use hilo dental una vez al da. Haga al menos 30 minutos de ejercicio, 5 o ms das cada semana. No   consuma ningn producto que contenga nicotina o tabaco. Estos productos incluyen cigarrillos, tabaco para Higher education careers adviser y aparatos de vapeo, como  los Psychologist, sport and exercise. Si necesita ayuda para dejar de fumar, consulte al mdico. No consuma drogas. Si es sexualmente activa, practique sexo seguro. Use un condn u otra forma de proteccin para prevenir las infecciones de transmisin sexual (ITS). Si no desea quedar embarazada, use un mtodo anticonceptivo. Si busca un embarazo, realice una consulta previa al embarazo con el mdico. Busque maneras saludables de Scientific laboratory technician, tales como: Meditacin, yoga o Conservation officer, nature. Lleve un diario personal. Hable con una persona confiable. Pase tiempo con amigos y familiares. Minimice la exposicin a la radiacin UV para reducir el riesgo de cncer de piel. Seguridad [place] siempre el cinturn de seguridad al conducir o viajar en un vehculo. No conduzca: Si ha estado bebiendo alcohol. No viaje con un conductor que ha estado bebiendo. Si ha estado usando sustancias o drogas que alteran la funcin mental. Mientras est enviando mensajes de texto. Si est cansada o distrada. Use un casco y otros equipos de proteccin [place] deportivas. Si tiene armas de fuego en su casa, asegrese de seguir todos los procedimientos de seguridad correspondientes. Busque ayuda si fue vctima de abuso fsico o abuso sexual. Cundo volver? Acuda al mdico una vez al ao para una visita anual de control de bienestar. Pregntele al mdico con qu frecuencia debe realizarse un control de la vista y los dientes. Mantenga su esquema de vacunacin al da. Esta informacin no tiene Marine scientist el consejo del mdico. Asegrese de hacerle al mdico cualquier pregunta que tenga. Document Revised: 09/16/2020 Document Reviewed: 09/16/2020 Elsevier Patient Education  [place].

## 2021-08-24 ENCOUNTER — Encounter: Payer: Self-pay | Admitting: Family

## 2021-08-24 DIAGNOSIS — R7303 Prediabetes: Secondary | ICD-10-CM | POA: Insufficient documentation

## 2021-08-24 LAB — HEPATIC FUNCTION PANEL
ALT: 25 IU/L (ref 0–32)
AST: 14 IU/L (ref 0–40)
Albumin: 4.2 g/dL (ref 3.8–4.8)
Alkaline Phosphatase: 97 IU/L (ref 44–121)
Bilirubin Total: 1.3 mg/dL — ABNORMAL HIGH (ref 0.0–1.2)
Bilirubin, Direct: 0.28 mg/dL (ref 0.00–0.40)
Total Protein: 6.7 g/dL (ref 6.0–8.5)

## 2021-08-24 LAB — LIPID PANEL
Chol/HDL Ratio: 2.7 ratio (ref 0.0–4.4)
Cholesterol, Total: 112 mg/dL (ref 100–199)
HDL: 41 mg/dL (ref >39)
LDL Chol Calc (NIH): 54 mg/dL (ref 0–99)
Triglycerides: 87 mg/dL (ref 0–149)
VLDL Cholesterol Cal: 17 mg/dL (ref 5–40)

## 2021-08-24 LAB — HEMOGLOBIN A1C
Est. average glucose Bld gHb Est-mCnc: 123 mg/dL
Hgb A1c MFr Bld: 5.9 % — ABNORMAL HIGH (ref 4.8–5.6)

## 2021-08-24 LAB — HEPATITIS C ANTIBODY: Hep C Virus Ab: NONREACTIVE

## 2021-08-24 LAB — VITAMIN D 25 HYDROXY (VIT D DEFICIENCY, FRACTURES): Vit D, 25-Hydroxy: 29.2 ng/mL — ABNORMAL LOW (ref 30.0–100.0)

## 2021-08-24 LAB — CALCIUM: Calcium: 9.1 mg/dL (ref 8.7–10.2)

## 2021-08-24 LAB — TSH: TSH: 0.985 u[IU]/mL (ref 0.450–4.500)

## 2022-08-24 ENCOUNTER — Encounter: Payer: Self-pay | Admitting: Family

## 2023-01-09 IMAGING — RF DG CHOLANGIOGRAM OPERATIVE
1 series · 3 of 3 positions shown · non-contrast
Comparison: CT AP and US Abdomen, 07/09/2021.

CLINICAL DATA: Cholelithiasis with pancreatitis.

EXAM:
INTRAOPERATIVE CHOLANGIOGRAM

[Series 1: dg cholangiogram · 0.20mm/px · 3 of 3 slices shown]
[im 1/3]
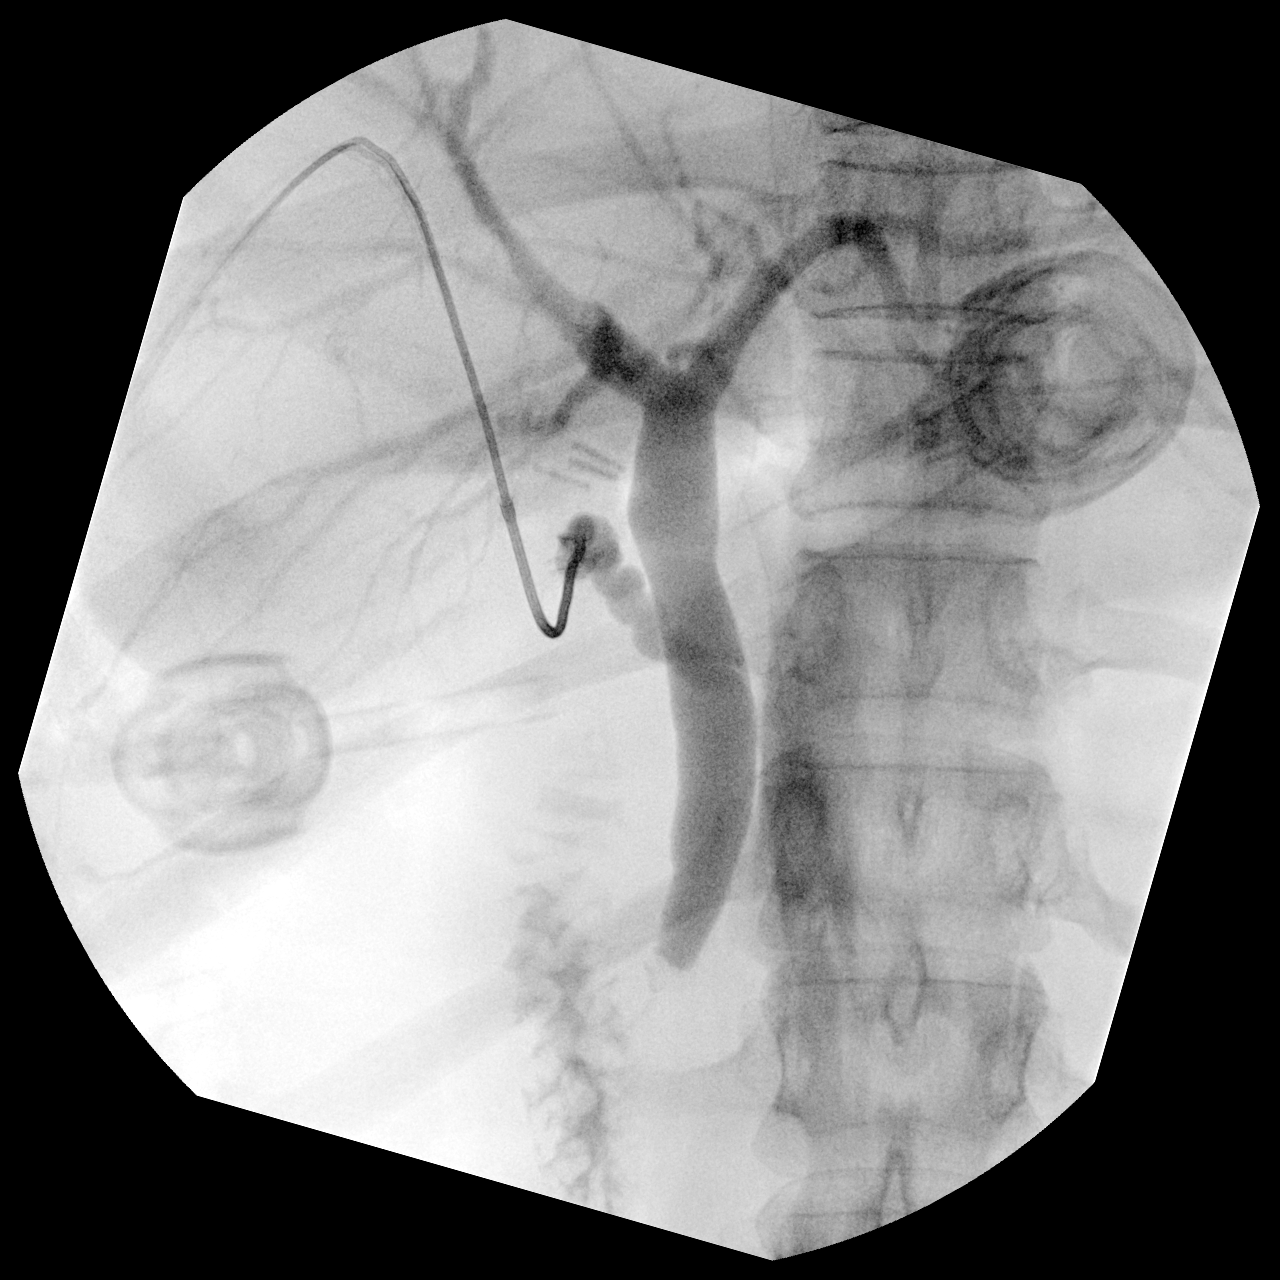
[im 2/3]
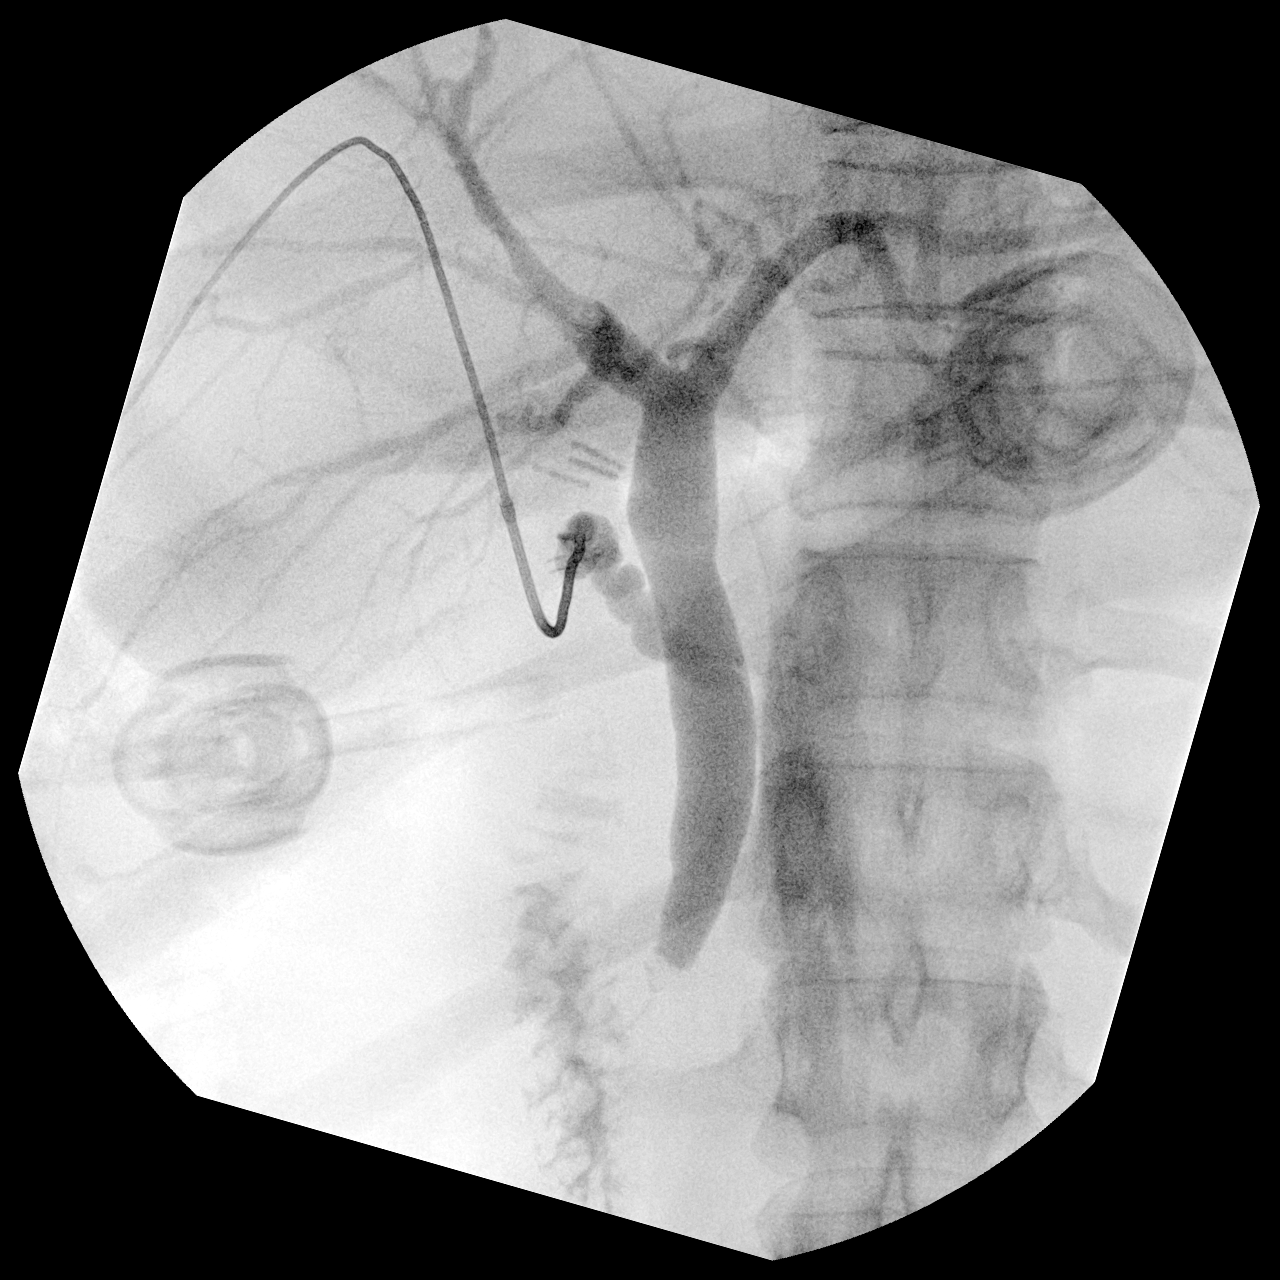
[im 3/3]
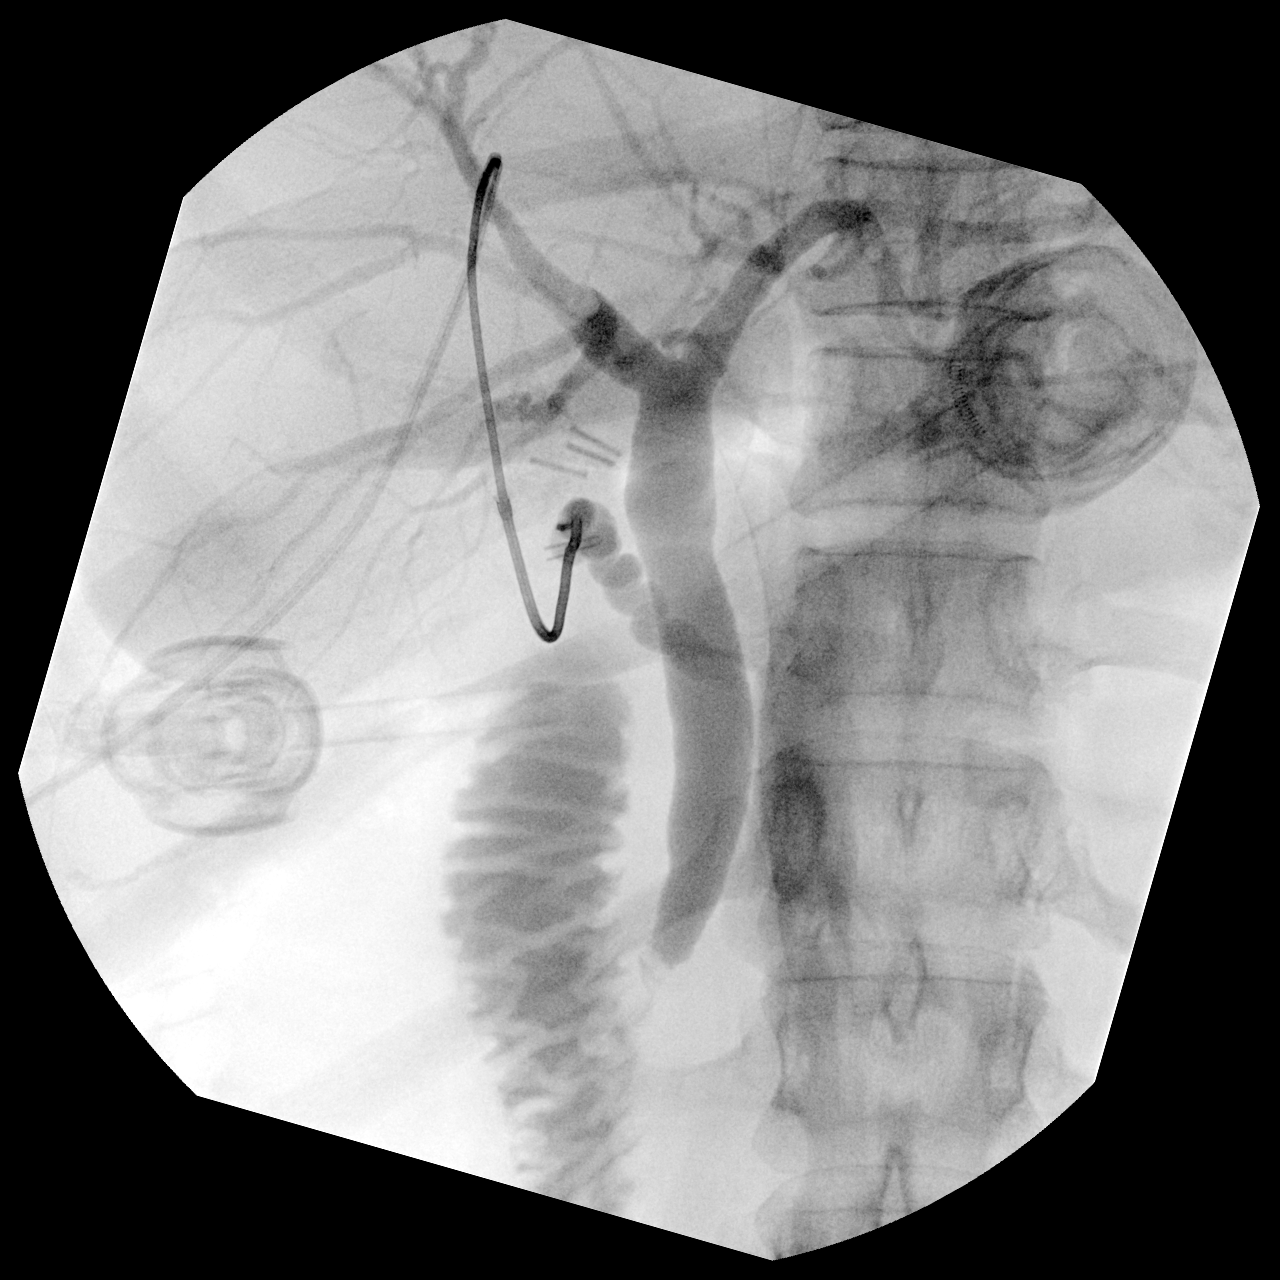

[3 of 3 positions shown; findings below may reference images not displayed]

FLUOROSCOPY:
Exposure Index (as provided by the fluoroscopic device): 2.4 mGy
Kerma
FINDINGS: Multiple, limited oblique planar images of the RIGHT upper quadrant
obtained C-arm.

Images demonstrating laparoscopic instrumentation, cystic duct
cannulation and antegrade cholangiogram.

Spillage of contrast into the duodenum. Distal common bile duct
filling defect. See key image. Minimal extrahepatic biliary ductal
dilation.
IMPRESSION: Fluoroscopic imaging for intraoperative cholangiogram. Distal common
bile duct filling defect with minimal extrahepatic biliary ductal
dilation.

For complete description of intra procedural findings, please see
performing service dictation.
